# Patient Record
Sex: Female | Born: 1960 | Race: White | Hispanic: No | Marital: Married | Smoking: Former smoker
Health system: Southern US, Community
[De-identification: ages and names within clinical notes are randomized; demographics above are authoritative.]

## PROBLEM LIST (undated history)

## (undated) DIAGNOSIS — J189 Pneumonia, unspecified organism: Secondary | ICD-10-CM

## (undated) DIAGNOSIS — M1712 Unilateral primary osteoarthritis, left knee: Secondary | ICD-10-CM

## (undated) DIAGNOSIS — C439 Malignant melanoma of skin, unspecified: Secondary | ICD-10-CM

## (undated) DIAGNOSIS — K219 Gastro-esophageal reflux disease without esophagitis: Secondary | ICD-10-CM

## (undated) DIAGNOSIS — R112 Nausea with vomiting, unspecified: Secondary | ICD-10-CM

## (undated) DIAGNOSIS — R519 Headache, unspecified: Secondary | ICD-10-CM

## (undated) DIAGNOSIS — F419 Anxiety disorder, unspecified: Secondary | ICD-10-CM

## (undated) DIAGNOSIS — Z9889 Other specified postprocedural states: Secondary | ICD-10-CM

## (undated) HISTORY — PX: CARPAL TUNNEL RELEASE: SHX101

## (undated) HISTORY — PX: TUMOR REMOVAL: SHX12

## (undated) HISTORY — PX: ENDOMETRIAL ABLATION: SHX621

## (undated) HISTORY — PX: MEDIAL COLLATERAL LIGAMENT AND LATERAL COLLATERAL LIGAMENT REPAIR, KNEE: SHX2017

## (undated) HISTORY — PX: COLONOSCOPY: SHX174

## (undated) HISTORY — PX: WISDOM TOOTH EXTRACTION: SHX21

## (undated) HISTORY — PX: TONSILLECTOMY: SUR1361

## (undated) HISTORY — PX: ABDOMINAL HYSTERECTOMY: SHX81

## (undated) HISTORY — PX: MOUTH SURGERY: SHX715

---

## 1998-05-23 ENCOUNTER — Other Ambulatory Visit: Admission: RE | Admit: 1998-05-23 | Discharge: 1998-05-23 | Payer: Self-pay | Admitting: Obstetrics & Gynecology

## 1998-07-20 ENCOUNTER — Other Ambulatory Visit: Admission: RE | Admit: 1998-07-20 | Discharge: 1998-07-20 | Payer: Self-pay | Admitting: Otolaryngology

## 1999-05-24 ENCOUNTER — Other Ambulatory Visit: Admission: RE | Admit: 1999-05-24 | Discharge: 1999-05-24 | Payer: Self-pay | Admitting: Obstetrics & Gynecology

## 2000-07-16 ENCOUNTER — Other Ambulatory Visit: Admission: RE | Admit: 2000-07-16 | Discharge: 2000-07-16 | Payer: Self-pay | Admitting: Obstetrics & Gynecology

## 2001-08-01 ENCOUNTER — Other Ambulatory Visit: Admission: RE | Admit: 2001-08-01 | Discharge: 2001-08-01 | Payer: Self-pay | Admitting: Obstetrics & Gynecology

## 2002-08-03 ENCOUNTER — Other Ambulatory Visit: Admission: RE | Admit: 2002-08-03 | Discharge: 2002-08-03 | Payer: Self-pay | Admitting: Obstetrics & Gynecology

## 2003-09-08 ENCOUNTER — Other Ambulatory Visit: Admission: RE | Admit: 2003-09-08 | Discharge: 2003-09-08 | Payer: Self-pay | Admitting: Obstetrics & Gynecology

## 2004-09-13 ENCOUNTER — Other Ambulatory Visit: Admission: RE | Admit: 2004-09-13 | Discharge: 2004-09-13 | Payer: Self-pay | Admitting: Obstetrics & Gynecology

## 2005-09-28 ENCOUNTER — Other Ambulatory Visit: Admission: RE | Admit: 2005-09-28 | Discharge: 2005-09-28 | Payer: Self-pay | Admitting: Obstetrics & Gynecology

## 2009-05-04 ENCOUNTER — Ambulatory Visit: Payer: Self-pay | Admitting: Sports Medicine

## 2009-05-04 DIAGNOSIS — M2140 Flat foot [pes planus] (acquired), unspecified foot: Secondary | ICD-10-CM | POA: Insufficient documentation

## 2009-05-04 DIAGNOSIS — M79609 Pain in unspecified limb: Secondary | ICD-10-CM | POA: Insufficient documentation

## 2009-05-05 ENCOUNTER — Encounter: Payer: Self-pay | Admitting: Sports Medicine

## 2009-05-18 ENCOUNTER — Ambulatory Visit: Payer: Self-pay | Admitting: Sports Medicine

## 2009-06-06 ENCOUNTER — Ambulatory Visit: Payer: Self-pay | Admitting: Sports Medicine

## 2009-06-06 DIAGNOSIS — M84369A Stress fracture, unspecified tibia and fibula, initial encounter for fracture: Secondary | ICD-10-CM | POA: Insufficient documentation

## 2009-06-28 ENCOUNTER — Ambulatory Visit: Payer: Self-pay | Admitting: Sports Medicine

## 2009-12-22 ENCOUNTER — Ambulatory Visit: Payer: Self-pay | Admitting: Sports Medicine

## 2009-12-22 DIAGNOSIS — M76899 Other specified enthesopathies of unspecified lower limb, excluding foot: Secondary | ICD-10-CM | POA: Insufficient documentation

## 2010-01-03 ENCOUNTER — Ambulatory Visit: Payer: Self-pay | Admitting: Sports Medicine

## 2010-01-17 ENCOUNTER — Encounter: Payer: Self-pay | Admitting: Family Medicine

## 2010-01-17 ENCOUNTER — Ambulatory Visit: Payer: Self-pay | Admitting: Family Medicine

## 2010-01-17 DIAGNOSIS — M7632 Iliotibial band syndrome, left leg: Secondary | ICD-10-CM | POA: Insufficient documentation

## 2010-01-19 ENCOUNTER — Encounter: Admission: RE | Admit: 2010-01-19 | Discharge: 2010-02-15 | Payer: Self-pay | Admitting: Family Medicine

## 2010-02-16 ENCOUNTER — Ambulatory Visit: Payer: Self-pay | Admitting: Sports Medicine

## 2010-06-14 ENCOUNTER — Ambulatory Visit (HOSPITAL_BASED_OUTPATIENT_CLINIC_OR_DEPARTMENT_OTHER): Admission: RE | Admit: 2010-06-14 | Discharge: 2010-06-14 | Payer: Self-pay | Admitting: Orthopedic Surgery

## 2010-06-19 ENCOUNTER — Ambulatory Visit: Payer: Self-pay | Admitting: Sports Medicine

## 2010-06-19 DIAGNOSIS — R269 Unspecified abnormalities of gait and mobility: Secondary | ICD-10-CM | POA: Insufficient documentation

## 2010-08-22 ENCOUNTER — Ambulatory Visit
Admission: RE | Admit: 2010-08-22 | Discharge: 2010-08-22 | Payer: Self-pay | Source: Home / Self Care | Attending: Orthopedic Surgery | Admitting: Orthopedic Surgery

## 2010-09-28 NOTE — Assessment & Plan Note (Signed)
Summary: 2:15-HIP/THIGH PAIN,MC   Vital Signs:  Patient profile:   50 year old female BP sitting:   127 / 87  Vitals Entered By: Lillia Pauls CMA (December 22, 2009 2:57 PM)  History of Present Illness: Pt presents for left hip and thigh pain that she has had for one week that radiates down to her anterior knee. She has a history of a tibial stress fracture that has healed well. She was able to complete 2 half marathons afterwards without any pain. However, she has started to have left buttocks pain that travels down her lateral thigh and into her anterior knee. She can feel the pain when she walks but it is much worse when she runs. The pain is there at the start of her run and continues throughout the run. No swelling, bruising or recent injuries. The pain does wake her up at night if she rolls onto her left hip. She has tried ibuprofen and heat which are both mildly helpful. She is currently running about 18 miles per week.   Physical Exam  General:  alert and well-developed.   Head:  normocephalic and atraumatic.   Mouth:  MMM Neck:  supple.   Lungs:  normal respiratory effort.   Msk:  Left hip: Normal inspection + TTP over trochanteric bursa recreating her pain No TTP in her groin or SI joint Normal IR and ER with mild pain with ER of the hip 4/5 hip flexor and abductor strength + FABER test Neg pelvic tilt test  Equal leg lengths Neg SLR Tight IT band  Right Hip: Normal inspection Normal IR and ER of her hip without pain No TTP throughout the trochanteric bursa or groin 4/5 hip flexor and abductor strength Neg FABER test but still tight Neg pelvic tilt test  Neg SLR  Back: No scoliosis No TTP along her cervical, thoracic or lumbar spine Can walk on her heels and toes Normal strength and sensation Neurovascularly intact   Impression & Recommendations:  Problem # 1:  TROCHANTERIC BURSITIS (ICD-726.5)  Of the left hip 1. Patient consented to have a steroid  injection for pain relief Consent obtained and verified. Sterile betadine prep. Furthur cleansed with alcohol. Topical analgesic spray: Ethyl chloride. Joint: Left trochanteric bursa Approached in typical fashion with: Completed without difficulty Meds:40 mg of Kenalog, 4 cc of 1% lidocaine Needle:Spinal Aftercare instructions and Red flags advised. No heavy lifting for 3 days.  2. Given multiple cross over stretches and hip strengthening exercises 3. Return in 4-6 weeks for follow-up  Orders: Joint Aspirate / Injection, Large (20610) Kenalog 10mg  (4units) (J3301)  Problem # 2:  LEG PAIN, LEFT (ICD-729.5) From trochanteric bursa  Patient Instructions: 1)  Please do not do any heavy lifting or leg press for the next 3 days. 2)  Please do the hip exercises daily. 3)  We will see you back as needed in 3-4 weeks.

## 2010-09-28 NOTE — Assessment & Plan Note (Signed)
Summary: ORTHOTICS,MC   Vital Signs:  Patient profile:   50 year old female Height:      61 inches Weight:      124 pounds BMI:     23.51 BP sitting:   115 / 74  Vitals Entered By: Rochele Pages RN (June 19, 2010 11:32 AM)  History of Present Illness: Patient returns to try custom orthotics has been using super feet gets knee pain and had ITB sxs these are less w orthtoics but if she ups her mileage seems to get more sxs  In past year she has also had tibial stress fx and Hip issues/ SIJ issues all with relatively low running mileage 15 to 20 mpw  we discussed trying a custom orthotic to get more cushion and neutral position and see if we can cut down on injuries  Allergies (verified): 1)  ! * Cdn  Physical Exam  General:  Well-developed,well-nourished,in no acute distress; alert,appropriate and cooperative throughout examination Msk:  soft cast on RT arm from carpal tunnel surgery  normal leg lengths  mod arch bilat  tends to rotate RT foot outward w gait    overall form shows mod pronation more on RT  not tender today to testing   Impression & Recommendations:  Problem # 1:  ABNORMALITY OF GAIT (ICD-781.2)  will try to correct pronation in gait to see if we can lessen recurrent injuries  Patient was fitted for a standard, cushioned, semi-rigid orthotic.  The orthotic was heated and the patient stood on the orthotic blank positioned on the orthotic stand. The patient was positioned in subtalar neutral position and 10 degrees of ankle dorsiflexion in a weight bearing stance. After completion of molding a stable based was applied to the orthotic blank.   The blank was ground to a stable position for weight bearing. 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 Patient Level IV [25956] 2)  Orthotic Materials, each unit [L3002]

## 2010-09-28 NOTE — Assessment & Plan Note (Signed)
Summary: knee pain,mc   Vital Signs:  Patient profile:   50 year old female BP sitting:   94 / 61  Vitals Entered By: Lillia Pauls CMA (Jan 17, 2010 9:50 AM)  History of Present Illness: Pt presents with lateral left knee pain that has gotten worse for the past 2-3 weeks. She has a history of trochanteric bursitis on the left in addition to piriformis syndrome. She has been doing a lot of home exercises to work on her hip strength and to stretch out her piriformis muscle. Her hips are no longer bothering her at all. However, she has developed left lateral knee pain that becomes significant after only 5-6 steps into her jog. The pain is severe enough that she will walk 2-4 miles now instead of jogging at all. She denies any swelling or locking. No recent injuries to her left knee. She does feel a twinge of pain with going up stairs and rarely feels as if the knee may give out on her. She started using ankle weights (1.5lbs) this past week with her hip exercises.    Medications Prior to Update: 1)  None  Past History:  Past medical, surgical, family and social histories (including risk factors) reviewed, and no changes noted (except as noted below).  Family History: Reviewed history and no changes required.  Social History: Reviewed history and no changes required.  Physical Exam  General:  alert and well-developed.   Head:  normocephalic and atraumatic.   Ears:  Normal hearing Mouth:  MMM Neck:  supple.   Lungs:  normal respiratory effort.   Msk:  Left Knee: No bony abnormalities, edema or bruising Full ROM with flexion and extension + TTP over the distal IT band and about 4 cm up from the insertion of the IT band. Tightness of ITB noted as well Mild TTP over the medial and lateral joint line Neg McMurray's Neg extended leg bounce test Normal MCL, LCL, ACL and PCL with special testing 5/5 strength with resisted knee flexion and extension  Right Knee: Normal inspection,  palpation, ROM and strength Normal ACL, PCL, LCL and MCL with special testing Neg McMurray's sign  Left Hip: No TTP throughout Normal IR and ER without pain or catching Normal inspection 5/5 strength with resisted hip flexion and abduction 4/5 strength with resisted hip adduction  Right Hip: No TTP throughout Normal IR and ER without pain or catching Normal inspection 5/5 strength with resisted hip flexion and abduction  Equal Leg Lengths   Impression & Recommendations:  Problem # 1:  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 Given a handout of stretches that she can do at home 2. Use a foam roller and rolling stick to work on IT band myofascial release (she already has these at home) 3. Ice massage for 5-10 mimutes daily 4. Sent for formal physical therapy to continue to work on core strength 5. Attempted a thigh sleeve on her but we did not have a comfortable size. Consider this again in the future. 6. OK to walk/jog and slowly build back 7. Return in 4 weeks for follow-up  Problem # 2:  TROCHANTERIC BURSITIS (ICD-726.5) Assessment: Improved Continue exercises as she has gotten much stronger  Problem # 3:  LEG PAIN, LEFT (ICD-729.5) See above for treatment plan of IT band syndrome

## 2010-09-28 NOTE — Assessment & Plan Note (Signed)
Summary: F/U,MC   Vital Signs:  Patient profile:   50 year old female BP sitting:   111 / 71  Vitals Entered By: Lillia Pauls CMA (February 16, 2010 1:32 PM)  History of Present Illness: Pt presents for follow-up of left IT band syndrome. Her pain as decreased significantly and she feels as if her left knee is 100% better. She denies any symptoms during her running but she has only worked up to 2 miles per the discretion of her physical therapist. She worked with Brett Canales at [facility] and has been discharged from therapy since she has progressed very well. She has been biking and can go up to 8 miles without any pain. She has been using the foam roller and feels that this has been  helpful as well. No pain at rest or with activities and has not needed any medications for pain symptoms. She is sleeping very comfortably as well.   Past History:  Risk Factors: Smoking Status: never (05/18/2009)  Social History: Regular exercise-yes Does Patient Exercise:  yes  Physical Exam  General:  alert and well-developed.   Head:  normocephalic and atraumatic.   Ears:  Normal hearing Mouth:  MMM Neck:  supple and full ROM.   Lungs:  normal respiratory effort.   Msk:  Left Knee: No bony abnormalities, edema or bruising Full ROM without pain No TTP along the joint line or over the IT Band IT band is much looser than last time Neg McMurray's and extended leg bounce test Normal MCL, LCL, ACL and PCL with special testing 5/5 strength with resisted knee flexion and extension Neg FABER testing Neg pelvic tilt Normal IR and ER of her hip  Right Knee: Normal inspection, palpation, ROM and strength Normal MCL, LCL, ACL and PCL on special testing Neg McMurray's and extended leg bounce test Neg FABER Normal IR and ER of her hip    Impression & Recommendations:  Problem # 1:  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 Continue stretching, strengthening and gradually increasing mileage with icing afterwards 2. Return as needed  Problem # 2:  LEG PAIN, LEFT (ICD-729.5) Assessment: Improved Improved knee pain and thigh pain from IT band syndrome

## 2010-09-28 NOTE — Assessment & Plan Note (Signed)
Summary: HIP/LEG PAIN,LEG HURTS WHEN RUNNING,MC   Vital Signs:  Patient profile:   50 year old female BP sitting:   118 / 61  Vitals Entered By: Lillia Pauls CMA (Jan 03, 2010 2:00 PM)  History of Present Illness: 50 yo F runner returns for left lateral hip and thigh pain  Patient was seen 2 weeks ago, dx with trochanteric bursitis and given an injection She improved from this but still having lateral left thigh pain that starts after a few steps of running No problems on the right side Was not associated with buttock pain until she had a deep tissue massage in buttock within past week No numbness or tingling Symptoms do not go down beyond her knee. No bowel or bladder issues Has been compliant with hip abduction exercises and occasionally doing stretches Using tennis ball under left buttock which helps. Does not wake her at night now.  Physical Exam  General:  alert, well-developed, and well-nourished.   Msk:  Back: No gross deformity, bruising, or swelling. No midline or paraspinal TTP. FROM without reproduction of pain. Strength 5/5 BLEs except 4/5 with hip abduction. Reflexes 2+ and equal in patellar and achilles tendons. Sensation intact to light touch bilaterally Fabers + on left and also tight on the right Piriformis stretch also reproduces some pain on left. Negative SLRs. Leg lengths equal Favoring LLE with gait though fairly normal gait otherwise.  No knee wobble, not crossing midline with left currently.   Impression & Recommendations:  Problem # 1:  LEG PAIN, LEFT (ICD-729.5) Assessment Deteriorated 2/2 SI joint dysfunction and piriformis syndrome.  Continue abduction strengthening exercises.  Stretches reinforced and also informed needs to be compliant with these at least 6 weeks to notice much of a change.  Ok to run as long as not limping and pain <= 3/10.  Cross training as well. F/u in 6 weeks.  Patient Instructions: 1)  Cross train on the bike or an  elliptical if you can and this does not cause pain. 2)  Pick 2 or 3 stretches for your SI joint and IT band and do 3 of each and hold for 20-30 seconds once a day. 3)  Do the hip abduction exercises 3 x 10 once a day.  If these are too easy, add a 2 pound ankle weight. 4)  It is ok to run as long as you are not limping and the pain does not exceed a 3 on a scale of 10. 5)  If you develop numbness or tingling or pain radiates beyond your knee we can try some medications to calm down nerve irritation. 6)  Follow up with [place] in 6 weeks.

## 2010-09-28 NOTE — Letter (Signed)
Summary: [facility] PT Referral form  [facility] PT Referral form   Imported By: Marily Memos 01/17/2010 14:36:18  _____________________________________________________________________  External Attachment:    Type:   Image     Comment:   External Document

## 2010-10-16 NOTE — Op Note (Signed)
  NAMEBRELYNN, Emily Ballard NO.:  [id number]  MEDICAL RECORD NO.:  [id number]          PATIENT TYPE:  AMB  LOCATION:  DSC                          FACILITY:  [facility]  PHYSICIAN:  Cindee Salt, M.D.       DATE OF BIRTH:  19-May-1961  DATE OF PROCEDURE:  08/22/2010 DATE OF DISCHARGE:                              OPERATIVE REPORT   PREOPERATIVE DIAGNOSIS:  Carpal tunnel syndrome, left hand.  POSTOPERATIVE DIAGNOSIS:  Carpal tunnel syndrome, left hand.  OPERATION:  Decompression of left median nerve.  SURGEON:  Cindee Salt, MD  ASSISTANT:  Carolyne Fiscal, RN  ANESTHESIA:  Forearm-based IV regional.  ANESTHESIOLOGIST:  Sheldon Silvan, MD  HISTORY:  The patient is a 50 year old female with a history of carpal tunnel syndrome, EMG nerve conductions positive.  This has not responded to conservative treatment.  She has elected to undergo surgical release. Pre, peri, and postoperative course have been discussed along with risks and complications.  She is aware that there is no guarantee with surgery, possibility of infection, recurrence of injury to arteries, nerves, and tendons, incomplete relief of symptoms, and dystrophy.  In the preoperative area, the patient is seen, the extremity is marked by both the patient and surgeon, and antibiotic is given.  PROCEDURE:  The patient was brought to the operating room where a forearm-based IV regional anesthetic was carried out without difficulty. She was prepped using ChloraPrep, supine position with left arm free.  A 3-minute dry time was allowed.  A time-out was taken confirming the patient and procedure.  After adequate anesthesia was afforded, a longitudinal incision was made in the palm and carried down through the subcutaneous tissue.  Bleeders were electrocauterized.  Palmar fascia was split.  Superficial palmar arch was identified.  The flexor tendon to the ring little finger was identified to the ulnar side of the median nerve.   The carpal retinaculum was incised with sharp dissection.  A right-angle and Sewall retractor were placed between skin and forearm fascia.  The fascia was released for approximately a centimeter and half proximal to the wrist crease under direct vision.  Canal was explored. Air compression to the nerve was apparent.  No further lesions were identified.  The wound was irrigated.  Skin was closed with interrupted 5-0 Vicryl Rapide sutures.  Local infiltration with 0.25% Marcaine without epinephrine was given, approximately 8 mL was used.  A sterile compressive dressing and splint to the wrist with fingers free was applied.  On deflation of the tourniquet, all fingers immediately pinked.  She was taken to the recovery room for observation in satisfactory condition.  She will be discharged home to return the [facility] [place] in 1 week, on [place].          ______________________________ Cindee Salt, M.D.     GK/MEDQ  D:  08/22/2010  T:  08/22/2010  Job:  [id number]  Electronically Signed by Cindee Salt M.D. on 10/16/2010 12:14:15 PM

## 2010-10-16 NOTE — Op Note (Signed)
  Emily Ballard, MINICH NO.:  [id number]  MEDICAL RECORD NO.:  [id number]          PATIENT TYPE:  AMB  LOCATION:  DSC                          FACILITY:  [facility]  PHYSICIAN:  Cindee Salt, M.D.       DATE OF BIRTH:  20-Dec-1960  DATE OF PROCEDURE:  06/14/2010 DATE OF DISCHARGE:                              OPERATIVE REPORT   PREOPERATIVE DIAGNOSIS:  Carpal tunnel syndrome, right hand.  POSTOPERATIVE DIAGNOSIS:  Carpal tunnel syndrome, right hand.  OPERATION:  Decompression of right median nerve.  SURGEON:  Cindee Salt, MD  ASSISTANT:  Carolyne Fiscal, RN.  ANESTHESIA:  Forearm-based IV regional.  HISTORY:  The patient is a 50 year old female with a history of carpal tunnel syndrome.  EMG nerve conduction is positive.  This has not responded to conservative treatment.  She has elected to undergo surgical decompression.  Postoperative course have been discussed along with risks and complications.  She is aware there is no guarantee with the surgery, possibility of infection recurrence; injury to arteries, nerves, tendons; incomplete relief of symptoms, and dystrophy.  In the preoperative area, the patient is seen.  The extremity marked by both the patient and surgeon.  Antibiotic given.  PROCEDURE:  The patient was brought to the operating room where a forearm-based IV regional anesthetic was carried out without difficulty. __________ precautions were taken throughout the procedure.  She was prepped using ChloraPrep, supine position, right arm free.  A 3-minute dry time was allowed.  Time-out was taken, confirming the patient and procedure.  Following adequate anesthesia, a longitudinal incision was made in the palm, carried down through the subcutaneous tissue. Bleeders were electrocauterized.  Palmar fascia was split.  Superficial palmar arch identified.  The flexor tendon to the ring little finger identified to the ulnar side of the median nerve.  The  carpal retinaculum was incised with a sharp dissection.  Right-angled Sewall retractors were placed between the skin and forearm fascia.  Fascia was released for approximately a centimeter and a half proximal to the wrist crease under direct vision.  The canal was explored.  Area of compression of the nerve was apparent.  No further lesions were identified.  The wound was irrigated.  Skin closed with interrupted 5-0 Vicryl Rapide sutures.  A local infiltration with 0.25% Marcaine without epinephrine was given.  A sterile compressive dressing and splint were applied.  On deflation of the tourniquet, all fingers immediately pinked.  She was taken to the recovery room for observation in satisfactory condition.  She will be discharged home to return to the [facility] [place] in 1 week on [place].          ______________________________ Cindee Salt, M.D.     GK/MEDQ  D:  06/14/2010  T:  06/14/2010  Job:  [id number]  Electronically Signed by Cindee Salt M.D. on 10/16/2010 12:14:03 PM

## 2010-11-03 ENCOUNTER — Encounter: Payer: Self-pay | Admitting: Family Medicine

## 2010-11-03 ENCOUNTER — Ambulatory Visit: Payer: BC Managed Care – PPO | Admitting: Family Medicine

## 2010-11-03 DIAGNOSIS — IMO0002 Reserved for concepts with insufficient information to code with codable children: Secondary | ICD-10-CM | POA: Insufficient documentation

## 2010-11-07 NOTE — Assessment & Plan Note (Signed)
Summary: L GROIN PAIN X SUN   Vital Signs:  Patient profile:   50 year old female BP sitting:   121 / 73  Vitals Entered By: Lillia Pauls CMA (November 03, 2010 9:07 AM)  CC:  L groin pain.  History of Present Illness: 50yo female runner with c/o L groin pain x 5-days.  Currently training for [facility] 1/2 marathon which is in 2-weeks, running 30-35 miles/wk.  Pain located in Lt groin & medial thigh.  Denies injury or trauma, but started to feel pain & tightness along adductors following 12-mile run 5-days ago.  Denies swelling or bruising.  Taking aleve which is helpful.  Able to continue training & pain improves with activity.  Denies numbness/tingling.  Wears compressions shorts, but has not tried a thight wrap or sleeve.  Pain does not wake at night.  No significant pain with daily activities.  Hx of tibial stress fx on left 2-years ago.  Has custom orthotics that she wears while running.  Allergies: 1)  ! * Cdn  Review of Systems       per HPI, otherwise negative  Physical Exam  General:  Well-developed,well-nourished,in no acute distress; alert,appropriate and cooperative throughout examination Msk:  HIPS: FROM b/l, neg log roll.  Normal strength with pain with resisted adduction & flexion on the left.  TTP along adductors & along left pubic ramus, no tenderness over ASIS or AIIS.  No tenderness over greater troch.  No bruising or erythema.  Neg fulcrom test.  Able to perform hop test, but some pain along adductors.  FEET/ANKLES: FROM ankles b/l without pain, weakness, or instability.  Moderate arch b/l, no transverse arch breakdown.    GAIT: no leg length difference, no limp.  Slight pronation b/l with running & slight turning out of right foot.   Neurovascularly intact distally   Impression & Recommendations:  Problem # 1:  GROIN STRAIN (ICD-848.8) - rt adductor/hip flexor strain - Given body helix thigh sleeve sample -wear this with activity - Should avoid any running  for next 4-5 days, should cross-train with biking and/or elliptical during this time - When returning to running should limit runs to about 1/2 race distance - Follow-up 3-4 weeks  Patient Instructions: 1)  You have an adductor strain. 2)  Wear body helix with activity & running. 3)  No running for next 4-5 days - cross training with elliptical & stationary bike to keep up cardiovascular fitness. 4)  When returning to running try to keep runs no more than about 7-miles (1/2 race distance). 5)  Should stop running or walk if having pain or limping. 6)  continue to wear orthotics. 7)  Follow-up 3-4 weeks for re-evaluation.   Orders Added: 1)  Est. Patient Level III [04540]

## 2010-11-27 ENCOUNTER — Ambulatory Visit (INDEPENDENT_AMBULATORY_CARE_PROVIDER_SITE_OTHER): Payer: BC Managed Care – PPO | Admitting: Sports Medicine

## 2010-11-27 ENCOUNTER — Encounter: Payer: Self-pay | Admitting: Sports Medicine

## 2010-11-27 VITALS — BP 105/65 | HR 63 | Ht 63.0 in | Wt 126.0 lb

## 2010-11-27 DIAGNOSIS — IMO0002 Reserved for concepts with insufficient information to code with codable children: Secondary | ICD-10-CM

## 2010-11-27 NOTE — Patient Instructions (Signed)
Do the exercises as we discussed.  Avoid over-stretching your hip adductors.  You can continue to wear the sleeve for comfort.  Use heat before running to help loosen up the muscles.  Follow up in 4 weeks

## 2010-11-28 NOTE — Assessment & Plan Note (Signed)
-   rt adductor strain - advised continued use of  body helix thigh sleeve  - exercises given for strengthening hip adductors  - follow up in 4 weeks  - heat to loosen up muscles before exercise

## 2010-11-28 NOTE — Progress Notes (Signed)
  Subjective:    Patient ID: Emily Ballard, female    DOB: 02-28-1961, 50 y.o.   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  Started while training for 1/2 marathon, specifically with fixed resistance weighted adduction. Runs 15 miles/wk (was running 25-30 miles per week prior to injury).  Pain located in left medial thigh.  Denies any specific injury or trauma, but started to feel pain & tightness along adductors following 12-mile run 3-4 weeks ago. Denies swelling or bruising.  Ibuprofen provides some relief. Thigh sleeve (body helix) makes pain worse, but knee sleeve helps. Usually stiff first thing in morning or before run - has to warm up with walking 2 miles before trying to run. Denies numbness/tingling.  Pain does not wake at night. Also noticed left foot blisters which are new for her over the past few weeks.  Hx of tibial stress fx on left 2-years ago.  Has custom orthotics that she wears while running   Review of Systems As per HPI otherwise negative     Objective:   Physical Exam HIPS: FROM b/l; Normal strength but pain with resisted adduction & flexion on the left;  Spasm and TTP at left hip adductors; no tenderness over ASIS or AIIS.  No tenderness over greater trochanter;  No bruising or erythema. Tender to palpation at   FEET/ANKLES: FROM ankles b/l without pain, weakness, or instability.  Moderate arch b/l, no transverse arch breakdown.    GAIT: no leg length difference, +limp with running.  Slight pronation b/l with running   Neurovascularly intact distally    Ultrasound: fibrosis and increased vascularization at site of focal pain in left hip adductors    Assessment & Plan:   GROIN STRAIN (ICD-848.8) - rt adductor strain - advised continued use of  body helix thigh sleeve  - exercises given for strengthening hip adductors  - follow up in 4 weeks  - heat to loosen up muscles before exercise

## 2010-12-26 ENCOUNTER — Ambulatory Visit (INDEPENDENT_AMBULATORY_CARE_PROVIDER_SITE_OTHER): Payer: BC Managed Care – PPO | Admitting: Sports Medicine

## 2010-12-26 ENCOUNTER — Encounter: Payer: Self-pay | Admitting: Sports Medicine

## 2010-12-26 VITALS — BP 112/71

## 2010-12-26 DIAGNOSIS — IMO0002 Reserved for concepts with insufficient information to code with codable children: Secondary | ICD-10-CM

## 2010-12-26 DIAGNOSIS — M79609 Pain in unspecified limb: Secondary | ICD-10-CM

## 2010-12-26 NOTE — Progress Notes (Signed)
  Subjective:    Patient ID: Emily Ballard, female    DOB: 12-Jun-1961, 50 y.o.   MRN: [id number]  HPI  Pt presents to clinic for f/u of L hamstring strain which she states has improved tremendously.  States her pain has resolved.   Has been wearing bodyhelix thigh sleeve x 4 weeks.  Has not run in 4 weeks.  Cross training on bike.   Review of Systems     Objective:   Physical Exam     Good quad strength bilat Good abduction strength left Good HS strength with foot rotated in all positions bilat No tenderness to palpation Lt HS No HS defects, muscle looks strong Running gait reveals no limping and she appears to have even push off from the left and right leg.    Assessment & Plan:

## 2010-12-26 NOTE — Assessment & Plan Note (Signed)
This strain involved her medial hamstring. She has gotten good relief with the use of body he'll sleeve. I think she should continue using that over the next year. Try to keep up hamstring exercises at least 3 times a week.  ease back into running starting at 2 miles 3 times a week  She will return when necessary

## 2010-12-26 NOTE — Assessment & Plan Note (Signed)
Pain has currently resolved and she's not having to use any medications.

## 2010-12-26 NOTE — Patient Instructions (Signed)
Ok to ease back into running.   Continue Hamstring exercises at least 3 times per week Continue biking twice weekly Start runs at 2 miles per run for the first 2 weeks Week 3 - ok to do 3 miles per run Week 4- ok to do 4 miles per run then a 6 mile for long run Ok to increase long runs after you are comfortable with the 4 mile runs Follow up as needed

## 2011-03-22 ENCOUNTER — Encounter: Payer: Self-pay | Admitting: Sports Medicine

## 2011-03-22 ENCOUNTER — Ambulatory Visit (INDEPENDENT_AMBULATORY_CARE_PROVIDER_SITE_OTHER): Payer: BC Managed Care – PPO | Admitting: Sports Medicine

## 2011-03-22 DIAGNOSIS — IMO0002 Reserved for concepts with insufficient information to code with codable children: Secondary | ICD-10-CM

## 2011-03-23 NOTE — Progress Notes (Signed)
  Subjective:    Patient ID: Emily Ballard, female    DOB: 1960-12-24, 50 y.o.   MRN: [id number]  HPI  Emily Ballard is a 50 yo female patient complaining of right gluteal pain since last Tuesday when she was walking her dog and she felt a pop and sharp pain in her right gluteal area and she fell to the ground . She was able to walk shortly after but has been sore in her right gluteal area.The pain is 4/10, dull, worse with sitting on her right gluteus, better with rest. No numbness or tingiling radiated to her leg.   No past medical history on file. No current outpatient prescriptions on file prior to visit.   Allergies  Allergen Reactions  . Codeine   . Latex Other (See Comments)    Causes burn   History  Substance Use Topics  . Smoking status: Former Games developer  . Smokeless tobacco: Never Used  . Alcohol Use: Not on file     Review of Systems  Constitutional: Negative for fever, chills and fatigue.  Musculoskeletal:       Right gluteal pain with HPI       Objective:   Physical Exam  Constitutional: She appears well-developed and well-nourished.       BP 116/75  Pulse 69   Pulmonary/Chest: Effort normal and breath sounds normal.  Musculoskeletal:       Right gluteal area with no swelling or hematoma.There is TTP at the R ischial tuberosity. Pain with resisted extension. Sensation intact distally. R hip w/ FROM, with no pain. L Spine w/ FROM , no TTP.  Neurological: She is alert.  Skin: No rash noted. No erythema.  Psychiatric: She has a normal mood and affect.          Assessment & Plan:   1. Hamstring tear    Phase I hamstring exercise, Thigh sleeve Aleve for pain control. F/u in 4 weeks.

## 2011-04-24 ENCOUNTER — Ambulatory Visit: Payer: BC Managed Care – PPO | Admitting: Sports Medicine

## 2011-04-27 ENCOUNTER — Ambulatory Visit (INDEPENDENT_AMBULATORY_CARE_PROVIDER_SITE_OTHER): Payer: BC Managed Care – PPO | Admitting: Family Medicine

## 2011-04-27 DIAGNOSIS — S76319A Strain of muscle, fascia and tendon of the posterior muscle group at thigh level, unspecified thigh, initial encounter: Secondary | ICD-10-CM

## 2011-04-27 DIAGNOSIS — IMO0002 Reserved for concepts with insufficient information to code with codable children: Secondary | ICD-10-CM

## 2011-04-27 NOTE — Patient Instructions (Addendum)
At your gym, do 10-15 reps, 2 sets of ABduction exercises on teh machine. Start with ten pounds and work up gradually until you are slightly fatigued at the end of 15 reps. Rest a few minutes before doing repeat sets. Three to Four times a week 2. Do 15 posterior straight leg raises--I would do these daily--2-3 sets of 15.  3.Plyometrics: bound and land in running position; 3 sets of 15 level first; then up a rise; then down a rise; only advance when level is easy. No greater than 45 deg knee flexion.

## 2011-05-01 DIAGNOSIS — S76319A Strain of muscle, fascia and tendon of the posterior muscle group at thigh level, unspecified thigh, initial encounter: Secondary | ICD-10-CM | POA: Insufficient documentation

## 2011-05-01 NOTE — Progress Notes (Signed)
  Subjective:    Patient ID: Emily Ballard, female    DOB: October 02, 1960, 50 y.o.   MRN: [id number]  HPI  Ms Mirkin is coming today to f/u on her hamstring tear. She states that she is doing better, she has been using the thigh slevve and motrin, she has been doing the hamstring stretches as well. She still feel mild soreness in her right gluteal area 1-2/10 specially with activities. She is walking without a limp. No numbness or tingling radiated distally.  Patient Active Problem List  Diagnoses  . TROCHANTERIC BURSITIS  . ILIOTIBIAL BAND SYNDROME  . LEG PAIN, LEFT  . STRESS FRACTURE OF TIBIA OR FIBULA  . PES PLANUS  . ABNORMALITY OF GAIT  . GROIN STRAIN   Current Outpatient Prescriptions on File Prior to Visit  Medication Sig Dispense Refill  . cephALEXin (KEFLEX) 500 MG capsule Take 500 mg by mouth Twice daily.      . meloxicam (MOBIC) 7.5 MG tablet Take 7.5 mg by mouth Once daily as needed.      . Multiple Vitamin (MULTIVITAMIN) tablet Take 1 tablet by mouth daily.        Marland Kitchen PREMARIN 0.9 MG tablet Take 0.9 mg by mouth daily.      Marland Kitchen zolpidem (AMBIEN) 10 MG tablet Take 10 mg by mouth Once daily as needed.       Allergies  Allergen Reactions  . Codeine   . Latex Other (See Comments)    Causes burn      Review of Systems  Constitutional: Negative for fever, chills and fatigue.  Musculoskeletal: Negative for back pain, joint swelling and gait problem.       Right gluteal pain with hpi, improving  Neurological: Negative for tremors, weakness and numbness.       Objective:   Physical Exam  Constitutional: She appears well-developed and well-nourished.       There were no vitals taken for this visit.   Pulmonary/Chest: Effort normal.  Musculoskeletal:        Right gluteal area with no swelling or hematoma.There mild is TTP at the R ischial tuberosity.Mild  Pain with resisted extension. Sensation intact distally. R hip w/ FROM, with no pain. L Spine w/ FROM , no TTP.      Neurological: She is alert.  Skin: Skin is warm.  Psychiatric: She has a normal mood and affect. Judgment normal.     Assessment & Plan:   1. Hamstring strain     Phase II hamstring exercise,  Cont Thigh sleeve  Aleve for pain control.  F/u in 4 weeks

## 2011-05-25 ENCOUNTER — Ambulatory Visit (INDEPENDENT_AMBULATORY_CARE_PROVIDER_SITE_OTHER): Payer: BC Managed Care – PPO | Admitting: Family Medicine

## 2011-05-25 ENCOUNTER — Encounter: Payer: Self-pay | Admitting: Family Medicine

## 2011-05-25 VITALS — BP 114/73 | HR 63

## 2011-05-25 DIAGNOSIS — IMO0002 Reserved for concepts with insufficient information to code with codable children: Secondary | ICD-10-CM

## 2011-05-25 DIAGNOSIS — M2170 Unequal limb length (acquired), unspecified site: Secondary | ICD-10-CM

## 2011-05-25 DIAGNOSIS — S76319A Strain of muscle, fascia and tendon of the posterior muscle group at thigh level, unspecified thigh, initial encounter: Secondary | ICD-10-CM

## 2011-05-25 NOTE — Progress Notes (Addendum)
  Subjective:    Patient ID: Emily Ballard, female    DOB: 11-13-1960, 50 y.o.   MRN: [id number]  HPI  Cominh to F/U on her Right  hamstring sprain. She is 2 month after the initial injury on July/2012. She is doing much better, 80-90% as she states. She is able to alternate walking and running. She feels a mild soreness on her right hamstring with running. Her strength is back. She got new shoes, and she stopped using her custom orthotics which she states made her toes tight. She also states that she is of balance , she feels like she is tilted to the right.  Patient Active Problem List  Diagnoses  . TROCHANTERIC BURSITIS  . ILIOTIBIAL BAND SYNDROME  . LEG PAIN, LEFT  . STRESS FRACTURE OF TIBIA OR FIBULA  . PES PLANUS  . ABNORMALITY OF GAIT  . GROIN STRAIN  . Hamstring strain   Current Outpatient Prescriptions on File Prior to Visit  Medication Sig Dispense Refill  . cephALEXin (KEFLEX) 500 MG capsule Take 500 mg by mouth Twice daily.      . meloxicam (MOBIC) 7.5 MG tablet Take 7.5 mg by mouth Once daily as needed.      . Multiple Vitamin (MULTIVITAMIN) tablet Take 1 tablet by mouth daily.        Marland Kitchen PREMARIN 0.9 MG tablet Take 0.9 mg by mouth daily.      Marland Kitchen zolpidem (AMBIEN) 10 MG tablet Take 10 mg by mouth Once daily as needed.       Allergies  Allergen Reactions  . Codeine   . Latex Other (See Comments)    Causes burn      Review of Systems  Constitutional: Negative for fever, chills and fatigue.  Musculoskeletal: Negative for back pain, joint swelling and arthralgias.  Neurological: Negative for weakness and numbness.       Objective:   Physical Exam  Constitutional: She appears well-developed and well-nourished.       BP 114/73  Pulse 63   Neck: Normal range of motion. Neck supple.  Pulmonary/Chest: Effort normal.  Musculoskeletal:       Posterior right thigh w/ intact skin, no swelling no hematomas. There is no TTP in the left ischial tuberosity and proximal  hamstring muscle. No defect. There is mild pain with flexion against resistance. Strength 5/5 Gait independent without a limp. Sensation intact distally. Abductors strength 3/5 B/l. Leg length discrepancy, right leg shorter by 2 cm.     Neurological: She is alert.  Skin: Skin is warm. No rash noted. No erythema. No pallor.  Psychiatric: She has a normal mood and affect.          Assessment & Plan:   1. Hamstring strain   2. Leg length discrepancy   Right leg shorter by 2 cm   Right heel lift Abductor strengthening Hamstring stretches. Cont progressive return to running. F/u in 2 month I have reviewed the resident's note and agree with assessment and plan as stated. Denny Levy

## 2011-07-16 ENCOUNTER — Ambulatory Visit: Payer: BC Managed Care – PPO | Admitting: Family Medicine

## 2012-08-05 ENCOUNTER — Ambulatory Visit (INDEPENDENT_AMBULATORY_CARE_PROVIDER_SITE_OTHER): Payer: 59 | Admitting: Sports Medicine

## 2012-08-05 VITALS — BP 110/70 | Ht 61.0 in | Wt 124.0 lb

## 2012-08-05 DIAGNOSIS — R269 Unspecified abnormalities of gait and mobility: Secondary | ICD-10-CM

## 2012-08-05 NOTE — Progress Notes (Signed)
Patient ID: Emily Ballard, female   DOB: 03-Mar-1961, 51 y.o.   MRN: [id number]  Patient originally placed in orthotics for bilateral hip pain She is a serious runner and does a lot of half marathons uses a compression sleeve on her thigh and doing exercises she steadily lessen the amount of hip pain and now this is not very bothersome She also had problems with hamstring injuries Her gait was abnormal and so at one point we put her in orthotics and this lessen her symptoms even more She has been using the orthotics for couple years and comes back now for replacement Originally she had a tibial stress fracture and has had no sign of stress fracture since that time  Examination NAD  Both Knees: Normal to inspection with no erythema or effusion or obvious bony abnormalities. Palpation normal with no warmth or joint line tenderness or patellar tenderness or condyle tenderness. ROM normal in flexion and extension and lower leg rotation. Ligaments with solid consistent endpoints including ACL, PCL, LCL, MCL. Negative Mcmurray's and provocative meniscal tests. Non painful patellar compression. Patellar and quadriceps tendons unremarkable. Hamstring and quadriceps strength is norm  Hip abduction strength is normal bilaterallyal. Mild tenderness to palpation of the left greater trochanter none on the right  There is flattening of the longitudinal arch bilaterally and she has abnormal pronation with when not wearing orthotics

## 2012-08-05 NOTE — Assessment & Plan Note (Signed)
Patient was fitted for a : standard, cushioned, semi-rigid orthotic. The orthotic was heated and afterward the patient stood on the orthotic blank positioned on the orthotic stand. The patient was positioned in subtalar neutral position and 10 degrees of ankle dorsiflexion in a weight bearing stance. After completion of molding, a stable base was applied to the orthotic blank. The blank was ground to a stable position for weight bearing. Size: 7 red eva Base: blue EVA Posting: none Additional orthotic padding: none  Time 40 mins  Her running gait was improved in the orthotics to block the pronation  She also has much less side to side motion at her hips  She should use new pair for running and can use the older pair in walking shoes until they wear out even more  Return when necessary

## 2012-12-08 ENCOUNTER — Ambulatory Visit (INDEPENDENT_AMBULATORY_CARE_PROVIDER_SITE_OTHER): Payer: 59 | Admitting: Sports Medicine

## 2012-12-08 ENCOUNTER — Ambulatory Visit: Payer: 59 | Admitting: Family Medicine

## 2012-12-08 VITALS — BP 109/70 | Ht 61.0 in | Wt 125.0 lb

## 2012-12-08 DIAGNOSIS — IMO0002 Reserved for concepts with insufficient information to code with codable children: Secondary | ICD-10-CM

## 2012-12-08 DIAGNOSIS — S76312A Strain of muscle, fascia and tendon of the posterior muscle group at thigh level, left thigh, initial encounter: Secondary | ICD-10-CM

## 2012-12-08 NOTE — Progress Notes (Signed)
  Subjective:    Patient ID: Emily Ballard, female    DOB: 12-28-1960, 52 y.o.   MRN: [id number]  HPI  Pt presents to clinic for evaluation of lt posterior medial knee pain x 2 weeks.  Felt a pull with stretching after a run. Running about 25 miles per week.  Did [place] 1/2 marathon yesterday, pain is more posterior now.  Now feels that she cannot run without a limp Comes for evaluation      Review of Systems     Objective:   Physical Exam NAD  Slight swelling behind lt medial HS Good hip flexion strength lt Hip abduction strength good on lt Lt Knee: Normal to inspection with no erythema or effusion or obvious bony abnormalities. Palpation normal with no warmth or joint line tenderness or patellar tenderness or condyle tenderness. ROM normal in flexion and extension and lower leg rotation. Ligaments with solid consistent endpoints including ACL, PCL, LCL, MCL. Negative Mcmurray's and provocative meniscal tests. Non painful patellar compression. Patellar and quadriceps tendons unremarkable.  HS strength normal in neutral position With foot IR on lt HS was painful    With running- has antalgic gait favoring lt leg  MSK [place] Alond distal semimembranosus tendon is a hypoechoic fluid pocket This is seen on long and trans scans Some increase in doppler flow No tear seen         Assessment & Plan:

## 2012-12-08 NOTE — Assessment & Plan Note (Signed)
Distal HS strain LT Begin HS exercises Use her knee compression sleeve Cross train x 1 week Ease into running after this  If not resolved in 4 to 6 weeks we should reck and consider NTG

## 2012-12-08 NOTE — Patient Instructions (Addendum)
Try wearing knee sleeve for running, if it uncomfortable during runs- put on after running and wear for 30 min to 1 hour  Ice posterior knee after exercising   Do [place] Hamstring exercises- Diver  Extender Hamstring curl Hamstring swing Modified running lunge  Please follow up as needed  Thank you for seeing [place] today!

## 2013-01-06 ENCOUNTER — Ambulatory Visit: Payer: 59 | Admitting: Sports Medicine

## 2013-01-11 ENCOUNTER — Emergency Department (INDEPENDENT_AMBULATORY_CARE_PROVIDER_SITE_OTHER)
Admission: EM | Admit: 2013-01-11 | Discharge: 2013-01-11 | Disposition: A | Discharge status: Home / Self Care | Payer: 59 | Source: Home / Self Care

## 2013-01-11 ENCOUNTER — Encounter (HOSPITAL_COMMUNITY): Payer: Self-pay | Admitting: *Deleted

## 2013-01-11 DIAGNOSIS — S83412A Sprain of medial collateral ligament of left knee, initial encounter: Secondary | ICD-10-CM

## 2013-01-11 DIAGNOSIS — S83419A Sprain of medial collateral ligament of unspecified knee, initial encounter: Secondary | ICD-10-CM

## 2013-01-11 MED ORDER — DICLOFENAC SODIUM 1 % TD GEL: 4.0000 g | Freq: Four times a day (QID) | TRANSDERMAL | Status: DC

## 2013-01-11 NOTE — ED Provider Notes (Signed)
History     CSN: [id number]  Arrival date & time 01/11/13  1245   None     Chief Complaint  Patient presents with  . Knee Pain    (Consider location/radiation/quality/duration/timing/severity/associated sxs/prior treatment) Patient is a 52 y.o. female presenting with knee pain. The history is provided by the patient and the spouse.  Knee Pain Location:  Knee Time since incident:  3 hours (onset while running in 1/2 marathon this am) Injury: no   Knee location:  L knee Pain details:    Quality:  Aching   Severity:  Mild   Progression:  Unchanged Chronicity:  Recurrent (has seen dr fields for same.) Dislocation: no     History reviewed. No pertinent past medical history.  Past Surgical History  Procedure Laterality Date  . Cesarean section    . Abdominal hysterectomy      No family history on file.  History  Substance Use Topics  . Smoking status: Former Games developer  . Smokeless tobacco: Never Used  . Alcohol Use: Yes     Comment: occasionally    OB History   Grav Para Term Preterm Abortions TAB SAB Ect Mult Living                  Review of Systems  Constitutional: Negative.   Musculoskeletal: Positive for gait problem. Negative for joint swelling.    Allergies  Codeine and Latex  Home Medications   Current Outpatient Rx  Name  Route  Sig  Dispense  Refill  . cephALEXin (KEFLEX) 500 MG capsule   Oral   Take 500 mg by mouth Twice daily.         . Multiple Vitamin (MULTIVITAMIN) tablet   Oral   Take 1 tablet by mouth daily.           Marland Kitchen PREMARIN 0.9 MG tablet   Oral   Take 0.9 mg by mouth daily.         Marland Kitchen zolpidem (AMBIEN) 10 MG tablet   Oral   Take 10 mg by mouth Once daily as needed.         . diclofenac sodium (VOLTAREN) 1 % GEL   Topical   Apply 4 g topically 4 (four) times daily. Please instruct in dosing   3 Tube   1   . fluticasone (FLONASE) 50 MCG/ACT nasal spray   Nasal   Place 1 spray into the nose daily.         .  meloxicam (MOBIC) 7.5 MG tablet   Oral   Take 7.5 mg by mouth Once daily as needed.           BP 129/78  Pulse 73  Temp(Src) 99.1 F (37.3 C) (Oral)  Resp 17  SpO2 100%  Physical Exam  Nursing note and vitals reviewed. Constitutional: She is oriented to person, place, and time. She appears well-developed and well-nourished.  Abdominal: Soft. Bowel sounds are normal.  Musculoskeletal: She exhibits tenderness.       Left knee: She exhibits normal range of motion, no swelling, no effusion, no deformity, no LCL laxity, normal patellar mobility, no bony tenderness, normal meniscus and no MCL laxity. Tenderness found. Medial joint line tenderness noted.  Neurological: She is alert and oriented to person, place, and time.  Skin: Skin is warm and dry.    ED Course  Procedures (including critical care time)  Labs Reviewed - No data to display No results found.   1. Medial collateral ligament  sprain of knee, left, initial encounter       MDM          Linna Hoff, MD 01/11/13 1326

## 2013-01-11 NOTE — ED Notes (Signed)
Patient states severe pain left knee medial side after attempting to run a marathon of 13 miles. Patient states sharp pain and is unable to put weight on knee without pain.

## 2013-01-16 ENCOUNTER — Ambulatory Visit (INDEPENDENT_AMBULATORY_CARE_PROVIDER_SITE_OTHER): Payer: 59 | Admitting: Family Medicine

## 2013-01-16 ENCOUNTER — Encounter: Payer: Self-pay | Admitting: Family Medicine

## 2013-01-16 ENCOUNTER — Ambulatory Visit: Payer: 59 | Admitting: Family Medicine

## 2013-01-16 VITALS — BP 121/74 | HR 63 | Ht 61.0 in | Wt 125.0 lb

## 2013-01-16 DIAGNOSIS — M25562 Pain in left knee: Secondary | ICD-10-CM

## 2013-01-16 DIAGNOSIS — M25569 Pain in unspecified knee: Secondary | ICD-10-CM

## 2013-01-16 NOTE — Progress Notes (Signed)
  Subjective:    Patient ID: Emily Ballard, female    DOB: [birth year], 52 y.o.   MRN: [id number]  HPI Several weeks they got acutely worse on 18 May when she was attempting to run a half marathon. She got to mile 8 and had to stop. Had been having some medial sided left knee pain off and on for the last several weeks, worse with long runs. No specific injury. Did get new orthotics in December and got new shoes in January but they're the same type and model of shoe. Has had no change in her running environment, no other injuries, no other illnesses area has run several half marathon as before and is an enthusiastic cyclists as well.   PERTINENT  PMH / PSH: Previous history of right hamstring tear, left iliotibial band syndrome and unspecified laterality stress fracture of tibia. Review of Systems Denies unusual weight change, fever, sweats, chills. Has had no numbness in the left lower extremity, no swelling.    Objective:   Physical Exam  Vital signs are reviewed GENERAL: Well-developed female no acute distress KNEES: No effusion. Good VMO muscle bulk and strength. Left knee has full range of motion in flexion extension, ligamentously intact although she has pain with medial collateral ligament testing. The calf is soft. The popliteal space is benign. Distally she is neurovascular intact. ; Partial less than one third tear of the left in CL. There is increased Doppler activity and some surrounding fluid in this area. The menisci look fine. Patellar tendon and quadriceps tendon look fine.      Assessment & Plan:

## 2013-01-16 NOTE — Assessment & Plan Note (Signed)
Left knee MCL strain. I examined her orthotics and the new ones are just a little bit off axis. They are canting her a little bit to the medial portion of her heel. I put a small wedge on that orthotic was a very small first ray post. For the MCL strain, we will do conservative treatment. If she still having pain with ADLs in one week she'll call so we'll start nitroglycerin patch. We discussed that extensively today. If she's not having ADLs pain in a week then she'll continue with the very conservative therapy and start running in 2 weeks with half-mile. If She gets pain with that,  she'll call.   Otherwise she  Will use only the old orthotics. One month after she's totally pain-free and back to her typical running I'll have her start alternating orthotics. If she starts having pain again, call  and we will remake the left orthotic and we will be happy to do that for her for free.   She'll call the interim with problems. She did opt to buy  a body helix knee sleeve today which I think will be helpful. She was given voltaren gel at the [facility] &  OK to use that or when she runs out she can switch to Aspercreme.

## 2013-02-09 ENCOUNTER — Ambulatory Visit
Admission: RE | Admit: 2013-02-09 | Discharge: 2013-02-09 | Disposition: A | Discharge status: Home / Self Care | Payer: 59 | Source: Ambulatory Visit | Attending: Sports Medicine | Admitting: Sports Medicine

## 2013-02-09 ENCOUNTER — Ambulatory Visit (INDEPENDENT_AMBULATORY_CARE_PROVIDER_SITE_OTHER): Payer: 59 | Admitting: Sports Medicine

## 2013-02-09 ENCOUNTER — Encounter: Payer: Self-pay | Admitting: Sports Medicine

## 2013-02-09 VITALS — BP 125/74 | HR 61 | Ht 61.0 in | Wt 125.0 lb

## 2013-02-09 DIAGNOSIS — M25562 Pain in left knee: Secondary | ICD-10-CM

## 2013-02-09 DIAGNOSIS — M25569 Pain in unspecified knee: Secondary | ICD-10-CM

## 2013-02-09 NOTE — Progress Notes (Signed)
  Subjective:    Patient ID: Emily Ballard, female    DOB: April 27, 1961, 52 y.o.   MRN: [id number]  HPI Patient comes in today for followup on left knee pain. Pain has gotten worse since her last office visit. She initially injured this knee while stretching after a run back in April.. She felt a pop and a pull along the medial knee. She was initially diagnosed with a distal hamstring strain. When symptoms persisted a followup ultrasound suggested a partial tear to her MCL. He has been treated with topical anti-inflammatories, activity modification, and a body heel is compression sleeve. She has not been doing any running. She is able to bike without any pain. She felt like her pain was improving until she took a long flight to [place] [facility] one week ago. Now she is to the point to where she can't simply walk without limping. She localizes all of her pain to the medial knee. She's getting a feeling of tightness in the knee with extension. Occasional popping and feeling of wanting to give way. No problems with this knee prior to the injury back in April. No prior knee surgeries. At times her pain will radiate down the lateral aspect of the lower leg. Denies groin pain currently.    Review of Systems     Objective:   Physical Exam Well-developed, well-nourished. No acute distress. Awake alert and oriented x3  Left knee: Range of motion 0-120. Trace effusion. She is tender to palpation along the medial joint line with a positive McMurray's and positive Thessalys. There is no tenderness to palpation along the MCL and there is excellent painless stability with MCL stressing. No tenderness along the lateral joint line. Negative anterior drawer. Negative posterior drawer. No pain with patellar. Neurovascularly intact distally. Walking with a limp.      Assessment & Plan:  1. Persistent left knee pain worrisome for medial meniscal tear  Her MCL strain should be well-healed at this point. My concern is for a  medial meniscal tear that she may have suffered with her acute injury back in April. Therefore I recommended x-rays and an MRI scan to further evaluate. I will call the patient at [phone] with those results once available at which point we will delineate further treatment.

## 2013-02-09 NOTE — Patient Instructions (Addendum)
You have been scheduled for a MRI of your left knee  02/12/13 at 10 am.  Please arrive at [facility] at 9:30am.  [address] [place], [place] [phone]

## 2013-02-10 ENCOUNTER — Telehealth: Payer: Self-pay | Admitting: *Deleted

## 2013-02-10 NOTE — Telephone Encounter (Signed)
Message copied by Jacki Cones C on Tue Feb 10, 2013 10:03 AM ------      Message from: Reino Bellis R      Created: Mon Feb 09, 2013  6:07 PM      Regarding: xrays       Please call and let her know that her xrays are normal. Go ahead with MRI and I will call once results are available.            ----- Message -----         From: Rad Results In Interface         Sent: 02/09/2013   5:15 PM           To: Ralene Cork, DO                   ------

## 2013-02-10 NOTE — Telephone Encounter (Signed)
Notified pt to x-ray was normal, proceed with MRI as planned.

## 2013-02-12 ENCOUNTER — Ambulatory Visit
Admission: RE | Admit: 2013-02-12 | Discharge: 2013-02-12 | Disposition: A | Discharge status: Home / Self Care | Payer: 59 | Source: Ambulatory Visit | Attending: Sports Medicine | Admitting: Sports Medicine

## 2013-02-12 DIAGNOSIS — M25562 Pain in left knee: Secondary | ICD-10-CM

## 2013-02-13 ENCOUNTER — Telehealth: Payer: Self-pay | Admitting: *Deleted

## 2013-02-13 ENCOUNTER — Telehealth: Payer: Self-pay | Admitting: Sports Medicine

## 2013-02-13 NOTE — Telephone Encounter (Signed)
I spoke with the patient regarding MRI findings of her knee. She has a tear through the root of the medial meniscus. Minimal degenerative changes. Cruciate and collateral ligaments are intact. I recommended surgical consultation with Dr. Thurston Hole to discuss merits of arthroscopy. Patient is in agreement with that plan. I will set this referral and I will defer further workup and treatment to the discretion of Dr. Thurston Hole.

## 2013-02-13 NOTE — Telephone Encounter (Signed)
Scheduled pt for appt 02/17/13 at 9:30 am with Dr. Thurston Hole.  Pt notified of appt info.

## 2013-02-13 NOTE — Telephone Encounter (Signed)
Message copied by Mora Bellman on Fri Feb 13, 2013  9:19 AM ------      Message from: Reino Bellis R      Created: Fri Feb 13, 2013  8:16 AM      Regarding: referral       Please refer to Dr. Thurston Hole for a medial meniscal tear      ----- Message -----         From: Rad Results In Interface         Sent: 02/12/2013  12:09 PM           To: Ralene Cork, DO                   ------

## 2013-07-20 ENCOUNTER — Other Ambulatory Visit: Payer: Self-pay | Admitting: Oral Surgery

## 2013-07-20 DIAGNOSIS — M26609 Unspecified temporomandibular joint disorder, unspecified side: Secondary | ICD-10-CM

## 2013-07-29 ENCOUNTER — Ambulatory Visit
Admission: RE | Admit: 2013-07-29 | Discharge: 2013-07-29 | Disposition: A | Discharge status: Home / Self Care | Payer: 59 | Source: Ambulatory Visit | Attending: Oral Surgery | Admitting: Oral Surgery

## 2013-07-29 DIAGNOSIS — M26609 Unspecified temporomandibular joint disorder, unspecified side: Secondary | ICD-10-CM

## 2013-07-31 ENCOUNTER — Other Ambulatory Visit: Payer: 59

## 2013-08-13 ENCOUNTER — Encounter: Payer: Self-pay | Admitting: Sports Medicine

## 2013-08-13 ENCOUNTER — Ambulatory Visit (INDEPENDENT_AMBULATORY_CARE_PROVIDER_SITE_OTHER): Payer: 59 | Admitting: Sports Medicine

## 2013-08-13 VITALS — BP 119/76 | HR 78 | Ht 61.0 in | Wt 125.0 lb

## 2013-08-13 DIAGNOSIS — M25562 Pain in left knee: Secondary | ICD-10-CM

## 2013-08-13 DIAGNOSIS — S83207A Unspecified tear of unspecified meniscus, current injury, left knee, initial encounter: Secondary | ICD-10-CM | POA: Insufficient documentation

## 2013-08-13 DIAGNOSIS — S83207D Unspecified tear of unspecified meniscus, current injury, left knee, subsequent encounter: Secondary | ICD-10-CM

## 2013-08-13 DIAGNOSIS — Z5189 Encounter for other specified aftercare: Secondary | ICD-10-CM

## 2013-08-13 DIAGNOSIS — M25569 Pain in unspecified knee: Secondary | ICD-10-CM

## 2013-08-13 NOTE — Assessment & Plan Note (Signed)
Pain continues 4 months after her meniscus surgery but is not severe  I think she has made reasonable progress but still needs some changes to her program because I think she is still getting swelling

## 2013-08-13 NOTE — Progress Notes (Signed)
Patient ID: ENMA MAEDA, female   DOB: 1961/02/15, 52 y.o.   MRN: [id number]  Patient returns for followup of left knee pain She originally was thought to have an MCL tear but in any case this did not resolve and she was found to have a medial meniscus tear of the left knee She had surgery in August but still feels tightness and pain in the left knee She wonders if some change in her orthotic would help When she saw Dr. Lloyd Huger there was concerned that her in the repair orthotic actually didn't give enough medial support and may have contributed However, additional posting of the orthotic did not change the symptoms  Now she is doing the elliptical and doing biking She would like to get back to more walking but this causes some swelling and pain  Physical examination No acute distress  Right knee has -2 extension but has flexion to 145 Left knee shows -7 extension and flexion to 130 There is some puffiness in the left knee Ligaments feel stable  Leg length now is 1.5 cm short with lying primarily because of the flexion of the left knee  Gait reveals a mild antalgic limp with a Trendelenburg to the left  She has 2 pairs of orthotics and actually both of these look good to me

## 2013-08-13 NOTE — Assessment & Plan Note (Signed)
[facility] ultrasound There is a large effusion of the suprapatellar pouch extending 6 cm up into the thigh The quadriceps and patellar tendons are intact The medial joint line shows an area meniscus is absent As you move to the medial posterior half of the joint line there is still bulging of meniscus and some swelling There is slight swelling around the lateral meniscus but the meniscus itself looks normal  She is a partial meniscectomy and physical therapy I do not think she is fully recovered yet. Based on MRI and the surgical findings she probably has some element of arthritis. On examination the arthritis changes do not seem to be extensive.  I suggested that she start using her compression sleeve again. We'll put more emphasis on biking and less on the elliptical. I added a lift with foam to her left Orthotic until she can get full knee motion I don't think either pair of orthotics is a problem and she can continue using them  I would like to check her in 3 months and see if we can progress her program to more activity

## 2013-08-25 ENCOUNTER — Encounter: Payer: 59 | Admitting: Sports Medicine

## 2013-11-12 ENCOUNTER — Ambulatory Visit: Payer: 59 | Admitting: Sports Medicine

## 2013-11-26 ENCOUNTER — Ambulatory Visit (INDEPENDENT_AMBULATORY_CARE_PROVIDER_SITE_OTHER): Payer: 59 | Admitting: Sports Medicine

## 2013-11-26 ENCOUNTER — Encounter: Payer: Self-pay | Admitting: Sports Medicine

## 2013-11-26 VITALS — BP 110/60 | Ht 61.0 in | Wt 133.0 lb

## 2013-11-26 DIAGNOSIS — M25562 Pain in left knee: Secondary | ICD-10-CM

## 2013-11-26 DIAGNOSIS — M2140 Flat foot [pes planus] (acquired), unspecified foot: Secondary | ICD-10-CM

## 2013-11-26 DIAGNOSIS — M25569 Pain in unspecified knee: Secondary | ICD-10-CM

## 2013-11-26 NOTE — Assessment & Plan Note (Signed)
She continues to advance slowly. She's not return to running yet. Recommended she continue range of motion exercises as well as exercise bike and elliptical.

## 2013-11-26 NOTE — Progress Notes (Signed)
Patient ID: Emily Ballard, female   DOB: 26-Oct-1960, 53 y.o.   MRN: [id number] 53 year old female present for followup of left knee pain. Show partial meniscectomy of the left knee in August of 2014 as had persistent pain and difficulty in returning to run. Overall she feels that her flexibility in the knee has improved somewhat although she does continue to have some pain in his start running. She continues to ride an exercise bike, ride outdoors and uses the elliptical. The heel lift to compensate for her leg length discrepancy has helped improve her overall leg pain. She is also working on both extension and flexion of her left knee. She does report that her knee swells in the evening on occasion. She's been using a body helix compression sleeve after exercising to help alleviate the symptoms. In addition she's been icing her knee.  Review of systems as per history of present illness otherwise all systems negative  Examination: BP 110/60  Ht 5\' 1"  (1.549 m)  Wt 133 lb (60.328 kg)  BMI 25.14 kg/m2 Well-developed well-nourished 53 year old female awake alert and oriented no acute distress Knee: Mild effusion noted  Palpation normal with no warmth or joint line tenderness or patellar tenderness or condyle tenderness. ROM -4 extension of the left knee/-2 right knee, 135 flexion left/greater than 140 right Ligaments with solid consistent endpoints including ACL, PCL, LCL, MCL. Negative Mcmurray's and provocative meniscal tests. Non painful patellar compression. Patellar and quadriceps tendons unremarkable. Hamstring and quadriceps strength is normal.

## 2013-11-26 NOTE — Assessment & Plan Note (Signed)
Small modification made to the left arch of one of her orthotics appears today. It was rubbing and I shaved off a small portion of it. This alleviated the symptoms.

## 2014-02-22 ENCOUNTER — Ambulatory Visit (INDEPENDENT_AMBULATORY_CARE_PROVIDER_SITE_OTHER): Payer: 59 | Admitting: Licensed Clinical Social Worker

## 2014-02-22 DIAGNOSIS — F411 Generalized anxiety disorder: Secondary | ICD-10-CM | POA: Diagnosis not present

## 2014-03-12 ENCOUNTER — Ambulatory Visit (INDEPENDENT_AMBULATORY_CARE_PROVIDER_SITE_OTHER): Payer: 59 | Admitting: Licensed Clinical Social Worker

## 2014-03-12 DIAGNOSIS — F411 Generalized anxiety disorder: Secondary | ICD-10-CM

## 2014-04-05 ENCOUNTER — Ambulatory Visit (INDEPENDENT_AMBULATORY_CARE_PROVIDER_SITE_OTHER): Payer: 59 | Admitting: Licensed Clinical Social Worker

## 2014-04-05 DIAGNOSIS — F411 Generalized anxiety disorder: Secondary | ICD-10-CM

## 2014-04-23 DIAGNOSIS — K6289 Other specified diseases of anus and rectum: Secondary | ICD-10-CM | POA: Insufficient documentation

## 2014-04-23 DIAGNOSIS — K644 Residual hemorrhoidal skin tags: Secondary | ICD-10-CM | POA: Insufficient documentation

## 2014-05-20 ENCOUNTER — Ambulatory Visit (INDEPENDENT_AMBULATORY_CARE_PROVIDER_SITE_OTHER): Payer: 59 | Admitting: Sports Medicine

## 2014-05-20 ENCOUNTER — Encounter: Payer: Self-pay | Admitting: Sports Medicine

## 2014-05-20 VITALS — BP 101/57 | HR 56 | Ht 61.0 in | Wt 133.0 lb

## 2014-05-20 DIAGNOSIS — M7742 Metatarsalgia, left foot: Principal | ICD-10-CM

## 2014-05-20 DIAGNOSIS — M7750 Other enthesopathy of unspecified foot: Secondary | ICD-10-CM

## 2014-05-20 DIAGNOSIS — M7741 Metatarsalgia, right foot: Secondary | ICD-10-CM | POA: Insufficient documentation

## 2014-05-20 NOTE — Assessment & Plan Note (Addendum)
Forefoot pain secondary to flattening of the transverse arches bilaterally. On exam the right foot is further broken down but patient complains of more severe pain on the left - heel lifts ground down and metatarsal pads placed bilaterally - patient to return for pads in her dress shoes if these provide relief - recommend gradual return to running with walk-run to start  Patient had some pain relief and able to jog after placement of the MT pads She has 2 fxnal pairs of orthotics

## 2014-05-20 NOTE — Progress Notes (Signed)
Patient presents for forefoot pain L>R. Patient reports that about 2 months ago she stopped wearing her orthotics because of pain in her knee related to the heel lift. Since removing them she reports her knee pain has resolved but she started to have pain in the front part of her foot and some tingling and numbness on the toes. The pain is worse with walking or bike riding and is present with any weight bearing. She saw Dr. Noemi Chapel who recommended that she see Dr. Oneida Alar to have her orthotics revised.  She finally has recovered from the meniscus surgery and seems to have full movement of left knee now.  On exam she has flattening of both longitudinal and transverse arches, R>L. On the right she has separation of the 1st-3rd toes from the 4th and 5th. On the left she has lifting of the great toe and turning and side callous formation of the 5th. She has tenderness on the inferior surface of the left foot between the 2nd and 3rd metatarsal heads. Mild TTP across left MT arch.  Running gait is neutral

## 2015-01-05 ENCOUNTER — Other Ambulatory Visit: Payer: Self-pay | Admitting: Obstetrics & Gynecology

## 2015-01-06 LAB — CYTOLOGY - PAP

## 2015-01-18 ENCOUNTER — Ambulatory Visit (INDEPENDENT_AMBULATORY_CARE_PROVIDER_SITE_OTHER): Payer: 59

## 2015-01-18 ENCOUNTER — Ambulatory Visit (INDEPENDENT_AMBULATORY_CARE_PROVIDER_SITE_OTHER): Payer: 59 | Admitting: Podiatry

## 2015-01-18 ENCOUNTER — Encounter: Payer: Self-pay | Admitting: Podiatry

## 2015-01-18 VITALS — BP 118/73 | HR 84 | Resp 16

## 2015-01-18 DIAGNOSIS — M21612 Bunion of left foot: Secondary | ICD-10-CM

## 2015-01-18 DIAGNOSIS — M779 Enthesopathy, unspecified: Secondary | ICD-10-CM

## 2015-01-18 DIAGNOSIS — M2012 Hallux valgus (acquired), left foot: Secondary | ICD-10-CM | POA: Diagnosis not present

## 2015-01-18 DIAGNOSIS — M7752 Other enthesopathy of left foot: Secondary | ICD-10-CM | POA: Diagnosis not present

## 2015-01-18 DIAGNOSIS — M778 Other enthesopathies, not elsewhere classified: Secondary | ICD-10-CM

## 2015-01-18 NOTE — Progress Notes (Signed)
   Subjective:    Patient ID: Emily Ballard, female    DOB: [birth year], 54 y.o.   MRN: [id number]  HPI Comments: "I have problems with the left foot"  Patient c/o aching plantar forefoot left for several months. She used to be an active runner, still active with outdoor activities. She also has arthritis in the toes and a bunion left. She has orthotics made by Dr. Oneida Alar with met pads. She is scheduled for knee surgery in October.     Review of Systems  Cardiovascular: Positive for leg swelling.       Relates most of leg swelling to her knee problem  Musculoskeletal: Positive for arthralgias.  All other systems reviewed and are negative.      Objective:   Physical Exam: I have reviewed her past medical history medications allergies surgery social history and review of systems. Pulses are strongly palpable left. Neurologic sensorium is intact percent ostial monofilament. Deep tendon reflexes are intact bilateral muscle strength +5 over 5 dorsiflexion plantar flexors and inverters everters onto the musculatures intact. Orthopedic evaluation was resolved with distal to the ankle range of motion without crepitation. Mild rigid hammertoe deformity second digit left foot pain on palpation second digit left foot particularly on end range of motion. The majority of the pain is located at the second metatarsophalangeal joint. Mild hallux valgus deformity is also noted left. All this is confirmed on radiographs.        Assessment & Plan:  Assessment: Hallux abductovalgus deformity of the left foot. Elongated plantarflexed second metatarsal resulting in capsulitis with hammertoe deformity rigid in nature the PIPJ second digit left.  Plan: Discussed etiology pathology conservative versus surgical therapies. Injected second metatarsophalangeal joint today after sterile Betadine skin prep. Discussed appropriate shoe gear stretching exercises ice therapy and shoe modifications. follow up with her in 1  month to 6 weeks if necessary.

## 2015-03-29 ENCOUNTER — Encounter: Payer: Self-pay | Admitting: Podiatry

## 2015-03-29 ENCOUNTER — Ambulatory Visit (INDEPENDENT_AMBULATORY_CARE_PROVIDER_SITE_OTHER): Payer: 59 | Admitting: Podiatry

## 2015-03-29 VITALS — BP 131/84 | HR 76 | Resp 12

## 2015-03-29 DIAGNOSIS — M778 Other enthesopathies, not elsewhere classified: Secondary | ICD-10-CM

## 2015-03-29 DIAGNOSIS — M779 Enthesopathy, unspecified: Principal | ICD-10-CM

## 2015-03-29 DIAGNOSIS — M7752 Other enthesopathy of left foot: Secondary | ICD-10-CM

## 2015-03-30 NOTE — Progress Notes (Signed)
She presents today for follow-up of capsulitis second metatarsophalangeal joint of the left foot. She states that it has improved considerably but it is still sore. She states that she takes meloxicam occasionally.  Objective: Vital signs are stable she is alert and oriented 3. Still has pain on palpation and range of motion of the second metatarsophalangeal joint of the left foot.  Assessment: Capsulitis second metatarsophalangeal joint left.  Plan: Injected 5 mg of Kenalog after sterile Betadine skin prep left second metatarsophalangeal joint. Encouraged her to continue anti-inflammatory's on a consistent basis discussed orthotics.

## 2015-06-08 ENCOUNTER — Encounter (HOSPITAL_COMMUNITY): Payer: Self-pay | Admitting: Physician Assistant

## 2015-06-08 DIAGNOSIS — M1712 Unilateral primary osteoarthritis, left knee: Secondary | ICD-10-CM | POA: Diagnosis present

## 2015-06-08 NOTE — H&P (Signed)
TOTAL KNEE ADMISSION H&P  Patient is being admitted for left total knee arthroplasty.  Subjective:  Chief Complaint:left knee pain.  HPI: Emily Ballard, 54 y.o. female, has a history of pain and functional disability in the left knee due to arthritis and has failed non-surgical conservative treatments for greater than 12 weeks to includeNSAID's and/or analgesics, corticosteriod injections, viscosupplementation injections, flexibility and strengthening excercises, supervised PT with diminished ADL's post treatment, use of assistive devices, weight reduction as appropriate and activity modification.  Onset of symptoms was gradual, starting 10 years ago with gradually worsening course since that time. The patient noted prior procedures on the knee to include  arthroscopy and menisectomy on the left knee(s).  Patient currently rates pain in the left knee(s) at 10 out of 10 with activity. Patient has night pain, worsening of pain with activity and weight bearing, pain that interferes with activities of daily living, crepitus and joint swelling.  Patient has evidence of subchondral sclerosis, periarticular osteophytes and joint space narrowing by imaging studies.  There is no active infection.  Patient Active Problem List   Diagnosis Date Noted  . Primary localized osteoarthritis of left knee   . Metatarsalgia of both feet 05/20/2014  . Tear of left meniscus as current injury 08/13/2013  . Left knee pain 01/16/2013  . Hamstring strain 05/01/2011  . GROIN STRAIN 11/03/2010  . ABNORMALITY OF GAIT 06/19/2010  . 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 TROCHANTERIC BURSITIS 12/22/2009  . STRESS FRACTURE OF TIBIA OR FIBULA 06/06/2009  . LEG PAIN, LEFT 05/04/2009  . PES PLANUS 05/04/2009   Past Medical History  Diagnosis Date  . Primary localized osteoarthritis of left knee     Past Surgical History  Procedure Laterality Date  . Cesarean section    . Abdominal hysterectomy      No prescriptions  prior to admission   Allergies  Allergen Reactions  . Codeine Nausea Only  . Latex Other (See Comments)    Causes burn  . Shellfish Allergy Diarrhea and Nausea And Vomiting   No current facility-administered medications for this encounter.  Current outpatient prescriptions:  .  ALPRAZolam (XANAX) 0.5 MG tablet, Take 0.5 mg by mouth at bedtime as needed for anxiety., Disp: , Rfl:  .  Ascorbic Acid (VITAMIN C PO), Take 1 tablet by mouth daily. , Disp: , Rfl:  .  cephALEXin (KEFLEX) 500 MG capsule, Take 500 mg by mouth daily. , Disp: , Rfl:  .  fexofenadine (ALLEGRA) 180 MG tablet, Take 180 mg by mouth daily., Disp: , Rfl:  .  Glucosamine-Chondroitin-MSM (TRIPLE FLEX PO), Take 1 tablet by mouth daily., Disp: , Rfl:  .  Magnesium 250 MG TABS, Take 250 mg by mouth daily., Disp: , Rfl:  .  Melatonin 10 MG TBCR, Take 10 mg by mouth at bedtime., Disp: , Rfl:  .  meloxicam (MOBIC) 15 MG tablet, Take 15 mg by mouth daily., Disp: , Rfl:  .  Methylsulfonylmethane (MSM) 1000 MG TABS, Take 1 tablet by mouth daily. , Disp: , Rfl:  .  OVER THE COUNTER MEDICATION, Take 1 tablet by mouth daily. Instaflush, Disp: , Rfl:  .  PREMARIN 0.9 MG tablet, Take 0.9 mg by mouth daily., Disp: , Rfl:  .  Probiotic Product (PROBIOTIC DAILY PO), Take 1 tablet by mouth daily. , Disp: , Rfl:  .  traZODone (DESYREL) 50 MG tablet, Take 37.5 mg by mouth at bedtime. , Disp: , Rfl:  .  Multiple Minerals-Vitamins (CALCIUM & VIT  D3 BONE HEALTH PO), Take 1 tablet by mouth daily., Disp: , Rfl:     Social History  Substance Use Topics  . Smoking status: Former Research scientist (life sciences)  . Smokeless tobacco: Never Used  . Alcohol Use: Yes     Comment: occasionally    No family history on file.   Review of Systems  Constitutional: Negative.   HENT: Negative.   Eyes: Negative.   Respiratory: Negative.   Cardiovascular: Negative.   Gastrointestinal: Negative.   Genitourinary: Negative.   Musculoskeletal: Positive for back pain and joint  pain.  Skin: Negative.   Neurological: Negative.   Endo/Heme/Allergies: Negative.   Psychiatric/Behavioral: Negative.     Objective:  Physical Exam  Constitutional: She appears well-developed and well-nourished.  HENT:  Head: Normocephalic and atraumatic.  Mouth/Throat: Oropharynx is clear and moist.  Eyes: Conjunctivae are normal. Pupils are equal, round, and reactive to light.  Neck: Neck supple.  Cardiovascular: Normal rate and normal heart sounds.   Respiratory: Effort normal.  GI: Soft.  Genitourinary:  Not pertinent to current symptomatology therefore not examined.  Musculoskeletal:  Examination of her left knee reveals pain medially and laterally.  There is 1+ synovitis. Full range of motion.  The knee is stable. Right knee full range of motion without pain swelling or deformity.  2+DP  Neurological: She is alert.  Skin: Skin is warm.  Psychiatric: She has a normal mood and affect.    Vital signs in last 24 hours: Temp:  [98.7 F (37.1 C)] 98.7 F (37.1 C) (10/12 1400) Pulse Rate:  [86] 86 (10/12 1400) BP: (128)/(77) 128/77 mmHg (10/12 1400) SpO2:  [98 %] 98 % (10/12 1400) Weight:  [60.328 kg (133 lb)] 60.328 kg (133 lb) (10/12 1400)  Labs:   Estimated body mass index is 25.97 kg/(m^2) as calculated from the following:   Height as of this encounter: 5' (1.524 m).   Weight as of this encounter: 60.328 kg (133 lb).   Imaging Review Plain radiographs demonstrate severe degenerative joint disease of the left knee(s). The overall alignment issignificant varus. The bone quality appears to be good for age and reported activity level.  Assessment/Plan:  End stage arthritis, left knee  Active Problems:   Primary localized osteoarthritis of left knee   The patient history, physical examination, clinical judgment of the provider and imaging studies are consistent with end stage degenerative joint disease of the left knee(s) and total knee arthroplasty is deemed  medically necessary. The treatment options including medical management, injection therapy arthroscopy and arthroplasty were discussed at length. The risks and benefits of total knee arthroplasty were presented and reviewed. The risks due to aseptic loosening, infection, stiffness, patella tracking problems, thromboembolic complications and other imponderables were discussed. The patient acknowledged the explanation, agreed to proceed with the plan and consent was signed. Patient is being admitted for inpatient treatment for surgery, pain control, PT, OT, prophylactic antibiotics, VTE prophylaxis, progressive ambulation and ADL's and discharge planning. The patient is planning to be discharged home with home health services   Balinda Heacock A. Kaleen Mask Physician Assistant [facility] [phone]  06/08/2015, 2:59 PM

## 2015-06-08 NOTE — Pre-Procedure Instructions (Signed)
Emily Ballard  06/08/2015      Your procedure is scheduled on Monday, October 24th, 2016 at 7:30 AM.  Report to [facility] at 5:30 A.M.  Call this number if you have problems the morning of surgery: [phone]   Remember:  Do not eat food or drink liquids after midnight.   Take these medicines the morning of surgery with A SIP OF WATER: Fexofenadine (Allegra).     Stop taking the following: Meloxicam (Mobic), Aspirin, NSAIDS, Aleve, Naproxen, Ibuprofen, Advil, Motrin, BC's, Goody's, fish oil, all herbal medications, and all vitamins on Monday October 17th    Do not wear jewelry, make-up or nail polish.  Do not wear lotions, powders, or perfumes.    Do not shave 48 hours prior to surgery.  Do not bring valuables to the hospital.  [facility] is not responsible for any belongings or valuables.  Contacts, dentures or bridgework may not be worn into surgery.  Leave your suitcase in the car.  After surgery it may be brought to your room.  For patients admitted to the hospital, discharge time will be determined by your treatment team.  Patients discharged the day of surgery will not be allowed to drive home.   Special instructions:  Shower using CHG soap the night before and the morning of your surgery  Please read over the following fact sheets that you were given. Pain Booklet, Coughing and Deep Breathing, MRSA Information and Surgical Site Infection Prevention

## 2015-06-09 ENCOUNTER — Encounter (HOSPITAL_COMMUNITY): Payer: Self-pay

## 2015-06-09 ENCOUNTER — Encounter (HOSPITAL_COMMUNITY)
Admission: RE | Admit: 2015-06-09 | Discharge: 2015-06-09 | Disposition: A | Discharge status: Home / Self Care | Payer: 59 | Source: Ambulatory Visit | Attending: Orthopedic Surgery | Admitting: Orthopedic Surgery

## 2015-06-09 DIAGNOSIS — Z01812 Encounter for preprocedural laboratory examination: Secondary | ICD-10-CM | POA: Diagnosis not present

## 2015-06-09 DIAGNOSIS — Z0183 Encounter for blood typing: Secondary | ICD-10-CM | POA: Diagnosis not present

## 2015-06-09 DIAGNOSIS — Z01818 Encounter for other preprocedural examination: Secondary | ICD-10-CM | POA: Insufficient documentation

## 2015-06-09 DIAGNOSIS — M179 Osteoarthritis of knee, unspecified: Secondary | ICD-10-CM | POA: Insufficient documentation

## 2015-06-09 DIAGNOSIS — R9431 Abnormal electrocardiogram [ECG] [EKG]: Secondary | ICD-10-CM | POA: Insufficient documentation

## 2015-06-09 HISTORY — DX: Other specified postprocedural states: R11.2

## 2015-06-09 HISTORY — DX: Gastro-esophageal reflux disease without esophagitis: K21.9

## 2015-06-09 HISTORY — DX: Pneumonia, unspecified organism: J18.9

## 2015-06-09 HISTORY — DX: Other specified postprocedural states: Z98.890

## 2015-06-09 HISTORY — DX: Nausea with vomiting, unspecified: R11.2

## 2015-06-09 LAB — CBC WITH DIFFERENTIAL/PLATELET
BASOS ABS: 0 10*3/uL (ref 0.0–0.1)
Basophils Relative: 1 %
EOS PCT: 3 %
Eosinophils Absolute: 0.2 10*3/uL (ref 0.0–0.7)
HCT: 42.5 % (ref 36.0–46.0)
Hemoglobin: 14.1 g/dL (ref 12.0–15.0)
LYMPHS PCT: 39 %
Lymphs Abs: 2.1 10*3/uL (ref 0.7–4.0)
MCH: 30.3 pg (ref 26.0–34.0)
MCHC: 33.2 g/dL (ref 30.0–36.0)
MCV: 91.4 fL (ref 78.0–100.0)
Monocytes Absolute: 0.4 10*3/uL (ref 0.1–1.0)
Monocytes Relative: 8 %
Neutro Abs: 2.7 10*3/uL (ref 1.7–7.7)
Neutrophils Relative %: 49 %
PLATELETS: 265 10*3/uL (ref 150–400)
RBC: 4.65 MIL/uL (ref 3.87–5.11)
RDW: 12.2 % (ref 11.5–15.5)
WBC: 5.5 10*3/uL (ref 4.0–10.5)

## 2015-06-09 LAB — COMPREHENSIVE METABOLIC PANEL
ALT: 28 U/L (ref 14–54)
AST: 35 U/L (ref 15–41)
Albumin: 3.7 g/dL (ref 3.5–5.0)
Alkaline Phosphatase: 40 U/L (ref 38–126)
Anion gap: 8 (ref 5–15)
BUN: 13 mg/dL (ref 6–20)
CHLORIDE: 105 mmol/L (ref 101–111)
CO2: 27 mmol/L (ref 22–32)
CREATININE: 0.99 mg/dL (ref 0.44–1.00)
Calcium: 9.5 mg/dL (ref 8.9–10.3)
GFR calc Af Amer: >60 mL/min (ref >60)
GLUCOSE: 90 mg/dL (ref 65–99)
Potassium: 4.5 mmol/L (ref 3.5–5.1)
SODIUM: 140 mmol/L (ref 135–145)
Total Bilirubin: 0.7 mg/dL (ref 0.3–1.2)
Total Protein: 6.6 g/dL (ref 6.5–8.1)

## 2015-06-09 LAB — APTT: APTT: 28 s (ref 24–37)

## 2015-06-09 LAB — SURGICAL PCR SCREEN
MRSA, PCR: NEGATIVE
Staphylococcus aureus: NEGATIVE

## 2015-06-09 LAB — TYPE AND SCREEN
ABO/RH(D): O POS
Antibody Screen: NEGATIVE

## 2015-06-09 LAB — PROTIME-INR
INR: 1.04 (ref 0.00–1.49)
Prothrombin Time: 13.8 seconds (ref 11.6–15.2)

## 2015-06-09 LAB — ABO/RH: ABO/RH(D): O POS

## 2015-06-10 LAB — URINE CULTURE

## 2015-06-19 MED ORDER — CHLORHEXIDINE GLUCONATE 4 % EX LIQD: 60.0000 mL | Freq: Once | CUTANEOUS | Status: DC

## 2015-06-19 MED ORDER — POVIDONE-IODINE 10 % EX SOLN
Freq: Once | CUTANEOUS | Status: DC
  Filled 2015-06-19: qty 118

## 2015-06-19 MED ORDER — LACTATED RINGERS IV SOLN
INTRAVENOUS | Status: DC
  Administered 2015-06-20 (×2): via INTRAVENOUS

## 2015-06-19 MED ORDER — CEFAZOLIN SODIUM-DEXTROSE 2-3 GM-% IV SOLR
2.0000 g | INTRAVENOUS | Status: AC
  Administered 2015-06-20: 2 g via INTRAVENOUS
  Filled 2015-06-19: qty 50

## 2015-06-20 ENCOUNTER — Encounter (HOSPITAL_COMMUNITY): Payer: Self-pay | Admitting: Certified Registered"

## 2015-06-20 ENCOUNTER — Inpatient Hospital Stay (HOSPITAL_COMMUNITY): Payer: 59 | Admitting: Certified Registered"

## 2015-06-20 ENCOUNTER — Encounter (HOSPITAL_COMMUNITY)
Admission: RE | Disposition: A | Discharge status: Home Health | Payer: Self-pay | Source: Ambulatory Visit | Attending: Orthopedic Surgery

## 2015-06-20 ENCOUNTER — Inpatient Hospital Stay (HOSPITAL_COMMUNITY)
Admission: RE | Admit: 2015-06-20 | Discharge: 2015-06-21 | DRG: 470 | Disposition: A | Discharge status: Home Health | Payer: 59 | Source: Ambulatory Visit | Attending: Orthopedic Surgery | Admitting: Orthopedic Surgery

## 2015-06-20 DIAGNOSIS — K219 Gastro-esophageal reflux disease without esophagitis: Secondary | ICD-10-CM | POA: Diagnosis present

## 2015-06-20 DIAGNOSIS — M25562 Pain in left knee: Secondary | ICD-10-CM | POA: Diagnosis present

## 2015-06-20 DIAGNOSIS — M179 Osteoarthritis of knee, unspecified: Secondary | ICD-10-CM | POA: Diagnosis present

## 2015-06-20 DIAGNOSIS — M1712 Unilateral primary osteoarthritis, left knee: Secondary | ICD-10-CM | POA: Diagnosis present

## 2015-06-20 DIAGNOSIS — Z87891 Personal history of nicotine dependence: Secondary | ICD-10-CM

## 2015-06-20 DIAGNOSIS — M1710 Unilateral primary osteoarthritis, unspecified knee: Secondary | ICD-10-CM | POA: Diagnosis present

## 2015-06-20 HISTORY — DX: Unilateral primary osteoarthritis, left knee: M17.12

## 2015-06-20 HISTORY — PX: TOTAL KNEE ARTHROPLASTY: SHX125

## 2015-06-20 SURGERY — ARTHROPLASTY, KNEE, TOTAL
Anesthesia: Spinal | Laterality: Left

## 2015-06-20 MED ORDER — PHENYLEPHRINE 40 MCG/ML (10ML) SYRINGE FOR IV PUSH (FOR BLOOD PRESSURE SUPPORT)
PREFILLED_SYRINGE | INTRAVENOUS | Status: AC
  Filled 2015-06-20: qty 10

## 2015-06-20 MED ORDER — EPHEDRINE SULFATE 50 MG/ML IJ SOLN
INTRAMUSCULAR | Status: AC
  Filled 2015-06-20: qty 1

## 2015-06-20 MED ORDER — BUPIVACAINE-EPINEPHRINE (PF) 0.25% -1:200000 IJ SOLN
INTRAMUSCULAR | Status: AC
  Filled 2015-06-20: qty 30

## 2015-06-20 MED ORDER — ALPRAZOLAM 0.5 MG PO TABS
0.5000 mg | ORAL_TABLET | Freq: Every evening | ORAL | Status: DC | PRN
  Administered 2015-06-21: 0.5 mg via ORAL
  Filled 2015-06-20: qty 1

## 2015-06-20 MED ORDER — MIDAZOLAM HCL 2 MG/2ML IJ SOLN
INTRAMUSCULAR | Status: AC
  Filled 2015-06-20: qty 4

## 2015-06-20 MED ORDER — DEXAMETHASONE SODIUM PHOSPHATE 4 MG/ML IJ SOLN
INTRAMUSCULAR | Status: AC
  Filled 2015-06-20: qty 2

## 2015-06-20 MED ORDER — HYDROMORPHONE HCL 1 MG/ML IJ SOLN
1.0000 mg | INTRAMUSCULAR | Status: DC | PRN
  Administered 2015-06-20 – 2015-06-21 (×7): 1 mg via INTRAVENOUS
  Filled 2015-06-20 (×8): qty 1

## 2015-06-20 MED ORDER — HYDROMORPHONE HCL 1 MG/ML IJ SOLN
0.2500 mg | INTRAMUSCULAR | Status: DC | PRN
  Administered 2015-06-20: 0.5 mg via INTRAVENOUS
  Administered 2015-06-20: 0.25 mg via INTRAVENOUS

## 2015-06-20 MED ORDER — FENTANYL CITRATE (PF) 100 MCG/2ML IJ SOLN
INTRAMUSCULAR | Status: DC | PRN
  Administered 2015-06-20 (×2): 25 ug via INTRAVENOUS
  Administered 2015-06-20: 50 ug via INTRAVENOUS
  Administered 2015-06-20 (×2): 25 ug via INTRAVENOUS

## 2015-06-20 MED ORDER — MAGNESIUM 250 MG PO TABS: 250.0000 mg | ORAL_TABLET | Freq: Every day | ORAL | Status: DC

## 2015-06-20 MED ORDER — HYDROMORPHONE HCL 1 MG/ML IJ SOLN
INTRAMUSCULAR | Status: AC
  Administered 2015-06-20: 0.25 mg via INTRAVENOUS
  Filled 2015-06-20: qty 1

## 2015-06-20 MED ORDER — POTASSIUM CHLORIDE IN NACL 20-0.9 MEQ/L-% IV SOLN
INTRAVENOUS | Status: DC
  Administered 2015-06-20: 10:00:00 via INTRAVENOUS
  Filled 2015-06-20: qty 1000

## 2015-06-20 MED ORDER — SCOPOLAMINE 1 MG/3DAYS TD PT72
MEDICATED_PATCH | TRANSDERMAL | Status: DC | PRN
  Administered 2015-06-20: 1 via TRANSDERMAL

## 2015-06-20 MED ORDER — PROPOFOL 10 MG/ML IV BOLUS
INTRAVENOUS | Status: AC
  Filled 2015-06-20: qty 20

## 2015-06-20 MED ORDER — ONDANSETRON HCL 4 MG/2ML IJ SOLN
INTRAMUSCULAR | Status: AC
  Filled 2015-06-20: qty 4

## 2015-06-20 MED ORDER — KETOROLAC TROMETHAMINE 30 MG/ML IJ SOLN
INTRAMUSCULAR | Status: AC
  Filled 2015-06-20: qty 1

## 2015-06-20 MED ORDER — EPHEDRINE SULFATE 50 MG/ML IJ SOLN
INTRAMUSCULAR | Status: DC | PRN
  Administered 2015-06-20: 5 mg via INTRAVENOUS

## 2015-06-20 MED ORDER — BUPIVACAINE-EPINEPHRINE 0.25% -1:200000 IJ SOLN
INTRAMUSCULAR | Status: DC | PRN
  Administered 2015-06-20: 30 mL

## 2015-06-20 MED ORDER — BUPIVACAINE IN DEXTROSE 0.75-8.25 % IT SOLN
INTRATHECAL | Status: DC | PRN
  Administered 2015-06-20: 1.6 mL via INTRATHECAL

## 2015-06-20 MED ORDER — LIDOCAINE HCL (CARDIAC) 20 MG/ML IV SOLN
INTRAVENOUS | Status: AC
Start: 2015-06-20 — End: 2015-06-20
  Filled 2015-06-20: qty 5

## 2015-06-20 MED ORDER — OXYCODONE HCL 5 MG PO TABS
5.0000 mg | ORAL_TABLET | ORAL | Status: DC | PRN
  Administered 2015-06-20: 10 mg via ORAL
  Administered 2015-06-21 (×4): 5 mg via ORAL
  Filled 2015-06-20: qty 1
  Filled 2015-06-20: qty 2
  Filled 2015-06-20 (×3): qty 1

## 2015-06-20 MED ORDER — APIXABAN 2.5 MG PO TABS
2.5000 mg | ORAL_TABLET | Freq: Two times a day (BID) | ORAL | Status: DC
  Administered 2015-06-21: 2.5 mg via ORAL
  Filled 2015-06-20: qty 1

## 2015-06-20 MED ORDER — DEXAMETHASONE SODIUM PHOSPHATE 4 MG/ML IJ SOLN
INTRAMUSCULAR | Status: AC
Start: 2015-06-20 — End: 2015-06-20
  Filled 2015-06-20: qty 1

## 2015-06-20 MED ORDER — ROCURONIUM BROMIDE 50 MG/5ML IV SOLN
INTRAVENOUS | Status: AC
  Filled 2015-06-20: qty 1

## 2015-06-20 MED ORDER — ACETAMINOPHEN 650 MG RE SUPP: 650.0000 mg | Freq: Four times a day (QID) | RECTAL | Status: DC | PRN

## 2015-06-20 MED ORDER — FENTANYL CITRATE (PF) 250 MCG/5ML IJ SOLN
INTRAMUSCULAR | Status: AC
  Filled 2015-06-20: qty 5

## 2015-06-20 MED ORDER — PHENYLEPHRINE HCL 10 MG/ML IJ SOLN
INTRAMUSCULAR | Status: DC | PRN
  Administered 2015-06-20: 80 ug via INTRAVENOUS
  Administered 2015-06-20 (×5): 40 ug via INTRAVENOUS
  Administered 2015-06-20: 80 ug via INTRAVENOUS
  Administered 2015-06-20: 40 ug via INTRAVENOUS

## 2015-06-20 MED ORDER — ONDANSETRON HCL 4 MG/2ML IJ SOLN
4.0000 mg | Freq: Four times a day (QID) | INTRAMUSCULAR | Status: DC | PRN
  Administered 2015-06-20: 4 mg via INTRAVENOUS
  Filled 2015-06-20: qty 2

## 2015-06-20 MED ORDER — METOCLOPRAMIDE HCL 5 MG/ML IJ SOLN: 5.0000 mg | Freq: Three times a day (TID) | INTRAMUSCULAR | Status: DC | PRN

## 2015-06-20 MED ORDER — PROPOFOL 10 MG/ML IV BOLUS
INTRAVENOUS | Status: DC | PRN
  Administered 2015-06-20: 20 mg via INTRAVENOUS

## 2015-06-20 MED ORDER — ACETAMINOPHEN 325 MG PO TABS: 650.0000 mg | ORAL_TABLET | Freq: Four times a day (QID) | ORAL | Status: DC | PRN

## 2015-06-20 MED ORDER — MAGNESIUM OXIDE 400 (241.3 MG) MG PO TABS
200.0000 mg | ORAL_TABLET | Freq: Every day | ORAL | Status: DC
  Administered 2015-06-21: 200 mg via ORAL
  Filled 2015-06-20: qty 1
  Filled 2015-06-20: qty 0.5

## 2015-06-20 MED ORDER — PHENOL 1.4 % MT LIQD: 1.0000 | OROMUCOSAL | Status: DC | PRN

## 2015-06-20 MED ORDER — MENTHOL 3 MG MT LOZG: 1.0000 | LOZENGE | OROMUCOSAL | Status: DC | PRN

## 2015-06-20 MED ORDER — KETOROLAC TROMETHAMINE 30 MG/ML IJ SOLN
INTRAMUSCULAR | Status: DC | PRN
  Administered 2015-06-20: 30 mg via INTRAVENOUS

## 2015-06-20 MED ORDER — TRAZODONE HCL 50 MG PO TABS
25.0000 mg | ORAL_TABLET | Freq: Every day | ORAL | Status: DC
  Administered 2015-06-20: 25 mg via ORAL
  Filled 2015-06-20: qty 1

## 2015-06-20 MED ORDER — PROPOFOL 500 MG/50ML IV EMUL
INTRAVENOUS | Status: DC | PRN
  Administered 2015-06-20 (×2): via INTRAVENOUS
  Administered 2015-06-20: 50 ug/kg/min via INTRAVENOUS

## 2015-06-20 MED ORDER — ONDANSETRON HCL 4 MG PO TABS: 4.0000 mg | ORAL_TABLET | Freq: Four times a day (QID) | ORAL | Status: DC | PRN

## 2015-06-20 MED ORDER — CELECOXIB 200 MG PO CAPS
200.0000 mg | ORAL_CAPSULE | Freq: Two times a day (BID) | ORAL | Status: DC
  Administered 2015-06-20 – 2015-06-21 (×2): 200 mg via ORAL
  Filled 2015-06-20 (×2): qty 1

## 2015-06-20 MED ORDER — DEXAMETHASONE SODIUM PHOSPHATE 10 MG/ML IJ SOLN
INTRAMUSCULAR | Status: DC | PRN
  Administered 2015-06-20: 6 mg via INTRAVENOUS
  Administered 2015-06-20: 4 mg via INTRAVENOUS

## 2015-06-20 MED ORDER — MELATONIN ER 10 MG PO TBCR: 10.0000 mg | EXTENDED_RELEASE_TABLET | Freq: Every day | ORAL | Status: DC

## 2015-06-20 MED ORDER — CEFAZOLIN SODIUM-DEXTROSE 2-3 GM-% IV SOLR
2.0000 g | Freq: Four times a day (QID) | INTRAVENOUS | Status: AC
  Administered 2015-06-20 (×2): 2 g via INTRAVENOUS
  Filled 2015-06-20 (×2): qty 50

## 2015-06-20 MED ORDER — DEXAMETHASONE SODIUM PHOSPHATE 10 MG/ML IJ SOLN
10.0000 mg | Freq: Three times a day (TID) | INTRAMUSCULAR | Status: AC
  Administered 2015-06-20 – 2015-06-21 (×3): 10 mg via INTRAVENOUS
  Filled 2015-06-20 (×3): qty 1

## 2015-06-20 MED ORDER — SODIUM CHLORIDE 0.9 % IR SOLN
Status: DC | PRN
  Administered 2015-06-20: 3000 mL

## 2015-06-20 MED ORDER — DOCUSATE SODIUM 100 MG PO CAPS
100.0000 mg | ORAL_CAPSULE | Freq: Two times a day (BID) | ORAL | Status: DC
  Administered 2015-06-20 – 2015-06-21 (×2): 100 mg via ORAL
  Filled 2015-06-20 (×2): qty 1

## 2015-06-20 MED ORDER — MIDAZOLAM HCL 5 MG/5ML IJ SOLN
INTRAMUSCULAR | Status: DC | PRN
  Administered 2015-06-20: 2 mg via INTRAVENOUS

## 2015-06-20 MED ORDER — LORATADINE 10 MG PO TABS
10.0000 mg | ORAL_TABLET | Freq: Every day | ORAL | Status: DC
  Administered 2015-06-21: 10 mg via ORAL
  Filled 2015-06-20: qty 1

## 2015-06-20 MED ORDER — SUCCINYLCHOLINE CHLORIDE 20 MG/ML IJ SOLN
INTRAMUSCULAR | Status: AC
  Filled 2015-06-20: qty 1

## 2015-06-20 MED ORDER — PROMETHAZINE HCL 25 MG/ML IJ SOLN: 6.2500 mg | INTRAMUSCULAR | Status: DC | PRN

## 2015-06-20 MED ORDER — DIPHENHYDRAMINE HCL 12.5 MG/5ML PO ELIX: 12.5000 mg | ORAL_SOLUTION | ORAL | Status: DC | PRN

## 2015-06-20 MED ORDER — METOCLOPRAMIDE HCL 5 MG PO TABS: 5.0000 mg | ORAL_TABLET | Freq: Three times a day (TID) | ORAL | Status: DC | PRN

## 2015-06-20 MED ORDER — ALUM & MAG HYDROXIDE-SIMETH 200-200-20 MG/5ML PO SUSP: 30.0000 mL | ORAL | Status: DC | PRN

## 2015-06-20 MED ORDER — POLYETHYLENE GLYCOL 3350 17 G PO PACK
17.0000 g | PACK | Freq: Two times a day (BID) | ORAL | Status: DC
  Administered 2015-06-20 – 2015-06-21 (×2): 17 g via ORAL
  Filled 2015-06-20 (×2): qty 1

## 2015-06-20 MED ORDER — ONDANSETRON HCL 4 MG/2ML IJ SOLN
INTRAMUSCULAR | Status: DC | PRN
  Administered 2015-06-20: 8 mg via INTRAVENOUS

## 2015-06-20 MED ORDER — METOPROLOL TARTRATE 1 MG/ML IV SOLN
INTRAVENOUS | Status: DC | PRN
  Administered 2015-06-20: 2 mg via INTRAVENOUS

## 2015-06-20 SURGICAL SUPPLY — 77 items
APL SKNCLS STERI-STRIP NONHPOA (GAUZE/BANDAGES/DRESSINGS) ×1
BANDAGE ESMARK 6X9 LF (GAUZE/BANDAGES/DRESSINGS) ×1 IMPLANT
BENZOIN TINCTURE PRP APPL 2/3 (GAUZE/BANDAGES/DRESSINGS) ×2 IMPLANT
BLADE SAGITTAL 25.0X1.19X90 (BLADE) ×2 IMPLANT
BLADE SAW SGTL 11.0X1.19X90.0M (BLADE) IMPLANT
BLADE SAW SGTL 13.0X1.19X90.0M (BLADE) ×2 IMPLANT
BLADE SURG 10 STRL SS (BLADE) ×4 IMPLANT
BNDG CMPR 9X6 STRL LF SNTH (GAUZE/BANDAGES/DRESSINGS) ×1
BNDG CMPR MED 15X6 ELC VLCR LF (GAUZE/BANDAGES/DRESSINGS) ×1
BNDG ELASTIC 6X15 VLCR STRL LF (GAUZE/BANDAGES/DRESSINGS) ×2 IMPLANT
BNDG ESMARK 6X9 LF (GAUZE/BANDAGES/DRESSINGS) ×2
BOWL SMART MIX CTS (DISPOSABLE) ×2 IMPLANT
CAPT KNEE TOTAL 3 ATTUNE ×1 IMPLANT
CATH FOLEY LATEX FREE 16FR (CATHETERS) ×2
CATH FOLEY LF 16FR (CATHETERS) IMPLANT
CEMENT HV SMART SET (Cement) ×4 IMPLANT
CLSR STERI-STRIP ANTIMIC 1/2X4 (GAUZE/BANDAGES/DRESSINGS) ×1 IMPLANT
COVER SURGICAL LIGHT HANDLE (MISCELLANEOUS) ×2 IMPLANT
CUFF TOURNIQUET SINGLE 34IN LL (TOURNIQUET CUFF) ×2 IMPLANT
DECANTER SPIKE VIAL GLASS SM (MISCELLANEOUS) ×2 IMPLANT
DRAPE EXTREMITY T 121X128X90 (DRAPE) ×2 IMPLANT
DRAPE INCISE IOBAN 66X45 STRL (DRAPES) ×2 IMPLANT
DRAPE PROXIMA HALF (DRAPES) ×2 IMPLANT
DRAPE U-SHAPE 47X51 STRL (DRAPES) ×2 IMPLANT
DRSG AQUACEL AG ADV 3.5X14 (GAUZE/BANDAGES/DRESSINGS) ×2 IMPLANT
DRSG PAD ABDOMINAL 8X10 ST (GAUZE/BANDAGES/DRESSINGS) ×4 IMPLANT
DURAPREP 26ML APPLICATOR (WOUND CARE) ×4 IMPLANT
ELECT CAUTERY BLADE 6.4 (BLADE) ×2 IMPLANT
ELECT REM PT RETURN 9FT ADLT (ELECTROSURGICAL) ×2
ELECTRODE REM PT RTRN 9FT ADLT (ELECTROSURGICAL) ×1 IMPLANT
EVACUATOR 1/8 PVC DRAIN (DRAIN) ×2 IMPLANT
FACESHIELD WRAPAROUND (MASK) ×2 IMPLANT
FACESHIELD WRAPAROUND OR TEAM (MASK) ×1 IMPLANT
GAUZE SPONGE 4X4 12PLY STRL (GAUZE/BANDAGES/DRESSINGS) ×2 IMPLANT
GLOVE BIO SURGEON STRL SZ7 (GLOVE) ×2 IMPLANT
GLOVE BIOGEL PI IND STRL 6.5 (GLOVE) IMPLANT
GLOVE BIOGEL PI IND STRL 7.0 (GLOVE) ×1 IMPLANT
GLOVE BIOGEL PI IND STRL 7.5 (GLOVE) ×1 IMPLANT
GLOVE BIOGEL PI INDICATOR 6.5 (GLOVE) ×6
GLOVE BIOGEL PI INDICATOR 7.0 (GLOVE) ×1
GLOVE BIOGEL PI INDICATOR 7.5 (GLOVE) ×1
GLOVE SS BIOGEL STRL SZ 7.5 (GLOVE) ×1 IMPLANT
GLOVE SUPERSENSE BIOGEL SZ 7.5 (GLOVE) ×1
GOWN STRL REUS W/ TWL LRG LVL3 (GOWN DISPOSABLE) ×1 IMPLANT
GOWN STRL REUS W/ TWL XL LVL3 (GOWN DISPOSABLE) ×2 IMPLANT
GOWN STRL REUS W/TWL LRG LVL3 (GOWN DISPOSABLE) ×8
GOWN STRL REUS W/TWL XL LVL3 (GOWN DISPOSABLE) ×4
HANDPIECE INTERPULSE COAX TIP (DISPOSABLE) ×2
HOOD PEEL AWAY FACE SHEILD DIS (HOOD) ×4 IMPLANT
IMMOBILIZER KNEE 22 UNIV (SOFTGOODS) ×2 IMPLANT
KIT BASIN OR (CUSTOM PROCEDURE TRAY) ×2 IMPLANT
KIT ROOM TURNOVER OR (KITS) ×2 IMPLANT
MANIFOLD NEPTUNE II (INSTRUMENTS) ×2 IMPLANT
MARKER SKIN DUAL TIP RULER LAB (MISCELLANEOUS) ×2 IMPLANT
NS IRRIG 1000ML POUR BTL (IV SOLUTION) ×2 IMPLANT
PACK TOTAL JOINT (CUSTOM PROCEDURE TRAY) ×2 IMPLANT
PACK UNIVERSAL I (CUSTOM PROCEDURE TRAY) ×2 IMPLANT
PAD ARMBOARD 7.5X6 YLW CONV (MISCELLANEOUS) ×4 IMPLANT
PADDING CAST COTTON 6X4 STRL (CAST SUPPLIES) ×2 IMPLANT
RUBBERBAND STERILE (MISCELLANEOUS) ×2 IMPLANT
SET HNDPC FAN SPRY TIP SCT (DISPOSABLE) ×1 IMPLANT
SPONGE GAUZE 4X4 12PLY STER LF (GAUZE/BANDAGES/DRESSINGS) ×1 IMPLANT
STRIP CLOSURE SKIN 1/2X4 (GAUZE/BANDAGES/DRESSINGS) ×2 IMPLANT
SUCTION FRAZIER TIP 10 FR DISP (SUCTIONS) ×2 IMPLANT
SUT MNCRL AB 3-0 PS2 18 (SUTURE) ×2 IMPLANT
SUT VIC AB 0 CT1 27 (SUTURE) ×4
SUT VIC AB 0 CT1 27XBRD ANBCTR (SUTURE) ×2 IMPLANT
SUT VIC AB 1 CT1 27 (SUTURE) ×2
SUT VIC AB 1 CT1 27XBRD ANBCTR (SUTURE) ×1 IMPLANT
SUT VIC AB 2-0 CT1 27 (SUTURE) ×4
SUT VIC AB 2-0 CT1 TAPERPNT 27 (SUTURE) ×2 IMPLANT
SYR 30ML SLIP (SYRINGE) ×2 IMPLANT
TOWEL OR 17X24 6PK STRL BLUE (TOWEL DISPOSABLE) ×2 IMPLANT
TOWEL OR 17X26 10 PK STRL BLUE (TOWEL DISPOSABLE) ×2 IMPLANT
TRAY FOLEY CATH 16FR SILVER (SET/KITS/TRAYS/PACK) ×1 IMPLANT
TUBE CONNECTING 12X1/4 (SUCTIONS) ×2 IMPLANT
YANKAUER SUCT BULB TIP NO VENT (SUCTIONS) ×2 IMPLANT

## 2015-06-20 NOTE — Transfer of Care (Signed)
Immediate Anesthesia Transfer of Care Note  Patient: Emily Ballard  Procedure(s) Performed: Procedure(s): TOTAL KNEE ARTHROPLASTY (Left)  Patient Location: PACU  Anesthesia Type:Spinal  Level of Consciousness: awake, alert , oriented and patient cooperative  Airway & Oxygen Therapy: Patient Spontanous Breathing  Post-op Assessment: Report given to RN and Post -op Vital signs reviewed and stable  Post vital signs: Reviewed and stable  Last Vitals:  Filed Vitals:   06/20/15 0613  BP: 134/89  Pulse: 93  Temp: 36.8 C  Resp: 20    Complications: No apparent anesthesia complications

## 2015-06-20 NOTE — Progress Notes (Signed)
Orthopedic Tech Progress Note Patient Details:  SARRAH FIORENZA 04-07-1961 [id number] On cpm at 7:15 pm Patient ID: BARBI KUMAGAI, female   DOB: 10/12/1960, 54 y.o.   MRN: [id number]   Braulio Bosch 06/20/2015, 7:19 PM

## 2015-06-20 NOTE — Progress Notes (Signed)
Orthopedic Tech Progress Note Patient Details:  Emily Ballard 08-24-1961 [id number]  CPM Left Knee CPM Left Knee: On Left Knee Flexion (Degrees): 90 Left Knee Extension (Degrees): 0 Additional Comments: trapeze bar patient helper Viewed order from doctor's order list  Hildred Priest 06/20/2015, 9:41 AM

## 2015-06-20 NOTE — Anesthesia Preprocedure Evaluation (Addendum)
Anesthesia Evaluation  Patient identified by MRN, date of birth, ID band Patient awake    Reviewed: Allergy & Precautions, NPO status , Patient's Chart, lab work & pertinent test results  History of Anesthesia Complications (+) PONV  Airway Mallampati: II  TM Distance: >3 FB Neck ROM: Full    Dental no notable dental hx.    Pulmonary neg pulmonary ROS, former smoker,    Pulmonary exam normal breath sounds clear to auscultation       Cardiovascular negative cardio ROS Normal cardiovascular exam Rhythm:Regular Rate:Normal     Neuro/Psych negative neurological ROS  negative psych ROS   GI/Hepatic Neg liver ROS, GERD  Medicated,  Endo/Other  negative endocrine ROS  Renal/GU negative Renal ROS  negative genitourinary   Musculoskeletal negative musculoskeletal ROS (+)   Abdominal   Peds negative pediatric ROS (+)  Hematology negative hematology ROS (+)   Anesthesia Other Findings   Reproductive/Obstetrics negative OB ROS                            Anesthesia Physical Anesthesia Plan  ASA: II  Anesthesia Plan: Spinal   Post-op Pain Management:    Induction: Intravenous  Airway Management Planned: Nasal Cannula  Additional Equipment:   Intra-op Plan:   Post-operative Plan:   Informed Consent: I have reviewed the patients History and Physical, chart, labs and discussed the procedure including the risks, benefits and alternatives for the proposed anesthesia with the patient or authorized representative who has indicated his/her understanding and acceptance.   Dental advisory given  Plan Discussed with: CRNA and Surgeon  Anesthesia Plan Comments:         Anesthesia Quick Evaluation

## 2015-06-20 NOTE — Op Note (Signed)
MRN:     [id number] DOB/AGE:    01-Dec-1960 / 54 y.o.       OPERATIVE REPORT    DATE OF PROCEDURE:  06/20/2015       PREOPERATIVE DIAGNOSIS:   PRIMARY LOCALIZED OA LEFT KNEE      Estimated body mass index is 25.97 kg/(m^2) as calculated from the following:   Height as of this encounter: 5' (1.524 m).   Weight as of this encounter: 60.328 kg (133 lb).                                                        POSTOPERATIVE DIAGNOSIS:   SAME                                                                     PROCEDURE:  Procedure(s): TOTAL KNEE ARTHROPLASTY Using Depuy Attune RP implants #3 Femur, #3Tibia, 30mm sigma RP bearing, 29 Patella     SURGEON: Jadae Steinke A    ASSISTANT:  Kirstin Shepperson PA-C   (Present and scrubbed throughout the case, critical for assistance with exposure, retraction, instrumentation, and closure.)         ANESTHESIA: GET with Femoral Nerve Block  DRAINS: foley, 2 medium hemovac in knee   TOURNIQUET TIME: 51ZGY   COMPLICATIONS:  None     SPECIMENS: None   INDICATIONS FOR PROCEDURE: The patient has  DJD LEFT KNEE, varus deformities, XR shows bone on bone arthritis. Patient has failed all conservative measures including anti-inflammatory medicines, narcotics, attempts at  exercise and weight loss, cortisone injections and viscosupplementation.  Risks and benefits of surgery have been discussed, questions answered.   DESCRIPTION OF PROCEDURE: The patient identified by armband, received  right femoral nerve block and IV antibiotics, in the holding area at [facility]. Patient taken to the operating room, appropriate anesthetic  monitors were attached General endotracheal anesthesia induced with  the patient in supine position, Foley catheter was inserted. Tourniquet  applied high to the operative thigh. Lateral post and foot positioner  applied to the table, the lower extremity was then prepped and draped  in usual sterile fashion from the ankle to  the tourniquet. Time-out procedure was performed. The limb was wrapped with an Esmarch bandage and the tourniquet inflated to 365 mmHg. We began the operation by making the anterior midline incision starting at handbreadth above the patella going over the patella 1 cm medial to and  4 cm distal to the tibial tubercle. Small bleeders in the skin and the  subcutaneous tissue identified and cauterized. Transverse retinaculum was incised and reflected medially and a medial parapatellar arthrotomy was accomplished. the patella was everted and theprepatellar fat pad resected. The superficial medial collateral  ligament was then elevated from anterior to posterior along the proximal  flare of the tibia and anterior half of the menisci resected. The knee was hyperflexed exposing bone on bone arthritis. Peripheral and notch osteophytes as well as the cruciate ligaments were then resected. We continued to  work our way around posteriorly along the proximal tibia, and externally  rotated  the tibia subluxing it out from underneath the femur. A McHale  retractor was placed through the notch and a lateral Hohmann retractor  placed, and we then drilled through the proximal tibia in line with the  axis of the tibia followed by an intramedullary guide rod and 2-degree  posterior slope cutting guide. The tibial cutting guide was pinned into place  allowing resection of 4 mm of bone medially and about 6 mm of bone  laterally because of her varus deformity. Satisfied with the tibial resection, we then  entered the distal femur 2 mm anterior to the PCL origin with the  intramedullary guide rod and applied the distal femoral cutting guide  set at 2mm, with 5 degrees of valgus. This was pinned along the  epicondylar axis. At this point, the distal femoral cut was accomplished without difficulty. We then sized for a #3 femoral component and pinned the guide in 3 degrees of external rotation.The chamfer cutting guide was  pinned into place. The anterior, posterior, and chamfer cuts were accomplished without difficulty followed by  the Attune RP box cutting guide and the box cut. We also removed posterior osteophytes from the posterior femoral condyles. At this  time, the knee was brought into full extension. We checked our  extension and flexion gaps and found them symmetric at 11mm.  The patella thickness measured at 20 mm. We set the cutting guide at 13 and removed the posterior 8 mm  of the patella sized for 29 button and drilled the lollipop. The knee  was then once again hyperflexed exposing the proximal tibia. We sized for a #3 tibial base plate, applied the smokestack and the conical reamer followed by the the Delta fin keel punch. We then hammered into place the Attune RP trial femoral component, inserted a 5-mm trial bearing, trial patellar button, and took the knee through range of motion from 0-130 degrees. No thumb pressure was required for patellar  tracking. At this point, all trial components were removed, a double batch of DePuy HV cement  was mixed and applied to all bony metallic mating surfaces except for the posterior condyles of the femur itself. In order, we  hammered into place the tibial tray and removed excess cement, the femoral component and removed excess cement, a 5-mm Attune RP bearing  was inserted, and the knee brought to full extension with compression.  The patellar button was clamped into place, and excess cement  removed. While the cement cured the wound was irrigated out with normal saline solution pulse lavage, and medium Hemovac drains were placed.. Ligament stability and patellar tracking were checked and found to be excellent. The tourniquet was then released and hemostasis was obtained with cautery. The parapatellar arthrotomy was closed with  #1 ethibond suture. The subcutaneous tissue with 0 and 2-0 undyed  Vicryl suture, and 4-0 Monocryl.. A dressing of Xeroform,  4 x 4,  dressing sponges, Webril, and Ace wrap applied. Needle and sponge count were correct times 2.The patient awakened, extubated, and taken to recovery room without difficulty. Vascular status was normal, pulses 2+ and symmetric.   Emily Ballard A 06/20/2015, 8:49 AM

## 2015-06-20 NOTE — Anesthesia Procedure Notes (Addendum)
Spinal Patient location during procedure: OR Staffing Performed by: anesthesiologist  Preanesthetic Checklist Completed: patient identified, site marked, surgical consent, pre-op evaluation, timeout performed, IV checked, risks and benefits discussed and monitors and equipment checked Spinal Block Patient position: sitting Prep: Betadine Patient monitoring: heart rate, continuous pulse ox and blood pressure Injection technique: single-shot Needle Needle type: Sprotte  Needle gauge: 24 G Needle length: 9 cm Additional Notes Expiration date of kit checked and confirmed. Patient tolerated procedure well, without complications.    Procedure Name: MAC Date/Time: 06/20/2015 7:10 AM Performed by: Duke Salvia Pre-anesthesia Checklist: Patient identified, Emergency Drugs available, Suction available, Patient being monitored and Timeout performed Patient Re-evaluated:Patient Re-evaluated prior to inductionOxygen Delivery Method: Non-rebreather mask Intubation Type: IV induction

## 2015-06-20 NOTE — Interval H&P Note (Signed)
History and Physical Interval Note:  06/20/2015 7:04 AM  Emily Ballard  has presented today for surgery, with the diagnosis of PRIMARY LOCALIZED OA LEFT KNEE  The various methods of treatment have been discussed with the patient and family. After consideration of risks, benefits and other options for treatment, the patient has consented to  Procedure(s): TOTAL KNEE ARTHROPLASTY (Left) as a surgical intervention .  The patient's history has been reviewed, patient examined, no change in status, stable for surgery.  I have reviewed the patient's chart and labs.  Questions were answered to the patient's satisfaction.     Elsie Saas A

## 2015-06-20 NOTE — Progress Notes (Signed)
Physical Therapy Treatment Patient Details Name: Emily Ballard MRN: [id number] DOB: 01-23-1961 Today's Date: 06/20/2015    History of Present Illness 54 y.o. female now s/p Lt TKA.     PT Comments    Patient able to ambulate 8 feet with min guard assistance. Patient with decreased reports of nausea and dizziness during second session. Will continue to progress mobility as tolerated for anticipated D/C to home with family assistance.   Follow Up Recommendations  Home health PT;Supervision for mobility/OOB     Equipment Recommendations  None recommended by PT    Recommendations for Other Services       Precautions / Restrictions Precautions Precautions: Knee;Fall Precaution Booklet Issued: Yes (comment) Precaution Comments: HEP provided, no pillow behind knee reviewed.  Required Braces or Orthoses: Knee Immobilizer - Left Restrictions Weight Bearing Restrictions: Yes LLE Weight Bearing: Weight bearing as tolerated    Mobility  Bed Mobility Overal bed mobility: Needs Assistance Bed Mobility: Supine to Sit     Supine to sit: Min guard    General bed mobility comments: HOB elevated, mild dizziness with initial sitting  Transfers Overall transfer level: Needs assistance Equipment used: Rolling walker (2 wheeled) Transfers: Sit to/from Stand Sit to Stand: Min guard         General transfer comment: cues for hand placement  Ambulation/Gait Ambulation/Gait assistance: Min guard Ambulation Distance (Feet): 8 Feet Assistive device: Rolling walker (2 wheeled) Gait Pattern/deviations: Step-to pattern;Decreased weight shift to left;Decreased stance time - left Gait velocity: decreased   General Gait Details: cues for gait sequence.    Stairs            Wheelchair Mobility    Modified Rankin (Stroke Patients Only)       Balance Overall balance assessment: Needs assistance Sitting-balance support: No upper extremity supported Sitting balance-Leahy  Scale: Good     Standing balance support: Bilateral upper extremity supported Standing balance-Leahy Scale: Poor Standing balance comment: using rw                    Cognition Arousal/Alertness: Awake/alert Behavior During Therapy: WFL for tasks assessed/performed Overall Cognitive Status: Within Functional Limits for tasks assessed                      Exercises      General Comments        Pertinent Vitals/Pain Pain Assessment: Faces Pain Score: 2  Faces Pain Scale: Hurts even more Pain Location: Lt knee Pain Descriptors / Indicators: Aching Pain Intervention(s): Limited activity within patient's tolerance;Monitored during session    Home Living      Prior Function          PT Goals (current goals can now be found in the care plan section) Acute Rehab PT Goals Patient Stated Goal: go home from the hospital PT Goal Formulation: With patient Time For Goal Achievement: 07/04/15 Potential to Achieve Goals: Good Progress towards PT goals: Progressing toward goals    Frequency  7X/week    PT Plan Current plan remains appropriate    Co-evaluation             End of Session Equipment Utilized During Treatment: Left knee immobilizer;Gait belt Activity Tolerance: Patient tolerated treatment well Patient left: in chair;with call bell/phone within reach;with family/visitor present;Other (comment) (in bone foam)     Time: 1191-4782 PT Time Calculation (min) (ACUTE ONLY): 14 min  Charges:   $Gait Training: 8-22 mins  G Codes:      Cassell Clement, PT, CSCS Pager [phone] Office [phone]  06/20/2015, 4:19 PM

## 2015-06-20 NOTE — Progress Notes (Signed)
Utilization review completed.  

## 2015-06-20 NOTE — Evaluation (Signed)
Physical Therapy Evaluation Patient Details Name: Emily Ballard MRN: [id number] DOB: 12-Feb-1961 Today's Date: 06/20/2015   History of Present Illness  54 y.o. female now s/p Lt TKA.   Clinical Impression  Pt is s/p TKA resulting in the deficits listed below (see PT Problem List). Pt will benefit from skilled PT to increase their independence and safety with mobility to allow discharge to the venue listed below. Able to sit edge of bed at this time, patient with mild nausea and dizziness. Anticipate patient will D/C to home with family assistance.      Follow Up Recommendations Home health PT;Supervision for mobility/OOB    Equipment Recommendations  None recommended by PT;Other (comment) (patient reports having rw at home)    Recommendations for Other Services       Precautions / Restrictions Precautions Precautions: Knee;Fall Precaution Booklet Issued: Yes (comment) Precaution Comments: HEP provided, no pillow behind knee reviewed.  Required Braces or Orthoses: Knee Immobilizer - Left Restrictions Weight Bearing Restrictions: Yes LLE Weight Bearing: Weight bearing as tolerated      Mobility  Bed Mobility Overal bed mobility: Needs Assistance Bed Mobility: Supine to Sit;Sit to Supine     Supine to sit: Mod assist Sit to supine: Mod assist   General bed mobility comments: assist with LLE  Transfers                 General transfer comment: unable to attempt, still numb from block.   Ambulation/Gait                Stairs            Wheelchair Mobility    Modified Rankin (Stroke Patients Only)       Balance Overall balance assessment: Needs assistance Sitting-balance support: Single extremity supported;No upper extremity supported Sitting balance-Leahy Scale: Fair                                       Pertinent Vitals/Pain Pain Assessment: 0-10 Pain Score: 2  Pain Location: Lt knee Pain Descriptors / Indicators:  Aching Pain Intervention(s): Limited activity within patient's tolerance;Monitored during session    Home Living Family/patient expects to be discharged to:: Private residence Living Arrangements: Spouse/significant other Available Help at Discharge: Family Type of Home: House Home Access: Stairs to enter Entrance Stairs-Rails: None Technical brewer of Steps: 1 Home Layout: Able to live on main level with bedroom/bathroom Home Equipment: Environmental consultant - 2 wheels      Prior Function Level of Independence: Independent               Hand Dominance        Extremity/Trunk Assessment   Upper Extremity Assessment: Defer to OT evaluation           Lower Extremity Assessment: LLE deficits/detail   LLE Deficits / Details: able to perform SLR     Communication   Communication: No difficulties  Cognition Arousal/Alertness: Awake/alert Behavior During Therapy: WFL for tasks assessed/performed Overall Cognitive Status: Within Functional Limits for tasks assessed                      General Comments      Exercises        Assessment/Plan    PT Assessment Patient needs continued PT services  PT Diagnosis Difficulty walking   PT Problem List Decreased strength;Decreased range of motion;Decreased balance;Decreased activity  tolerance;Decreased mobility  PT Treatment Interventions DME instruction;Gait training;Stair training;Functional mobility training;Therapeutic activities;Therapeutic exercise;Balance training;Patient/family education   PT Goals (Current goals can be found in the Care Plan section) Acute Rehab PT Goals Patient Stated Goal: go home from the hospital PT Goal Formulation: With patient Time For Goal Achievement: 07/04/15 Potential to Achieve Goals: Good    Frequency 7X/week   Barriers to discharge        Co-evaluation               End of Session Equipment Utilized During Treatment: Left knee immobilizer Activity Tolerance:  Other (comment) (mild nausea and dizziness when sitting edge of bed. ) Patient left: in bed;with call bell/phone within reach;with family/visitor present;with nursing/sitter in room;Other (comment) (in bone foam) Nurse Communication: Mobility status         Time: 1136-1209 PT Time Calculation (min) (ACUTE ONLY): 33 min   Charges:   PT Evaluation $Initial PT Evaluation Tier I: 1 Procedure PT Treatments $Therapeutic Activity: 8-22 mins   PT G Codes:        Cassell Clement, PT, CSCS Pager [phone] Office [phone]  06/20/2015, 12:39 PM

## 2015-06-20 NOTE — Progress Notes (Signed)
After MD reviewed EKG, pt. Was asked to get EKG with PCP, which she did last week & was told that it was normal.

## 2015-06-20 NOTE — Anesthesia Postprocedure Evaluation (Signed)
  Anesthesia Post-op Note  Patient: Emily Ballard  Procedure(s) Performed: Procedure(s) (LRB): TOTAL KNEE ARTHROPLASTY (Left)  Patient Location: PACU  Anesthesia Type: Spinal  Level of Consciousness: awake and alert   Airway and Oxygen Therapy: Patient Spontanous Breathing  Post-op Pain: mild  Post-op Assessment: Post-op Vital signs reviewed, Patient's Cardiovascular Status Stable, Respiratory Function Stable, Patent Airway and No signs of Nausea or vomiting  Last Vitals:  Filed Vitals:   06/20/15 0930  BP: 134/72  Pulse: 99  Temp:   Resp: 14    Post-op Vital Signs: stable   Complications: No apparent anesthesia complications

## 2015-06-21 ENCOUNTER — Encounter (HOSPITAL_COMMUNITY): Payer: Self-pay | Admitting: Orthopedic Surgery

## 2015-06-21 LAB — BASIC METABOLIC PANEL
ANION GAP: 6 (ref 5–15)
BUN: 9 mg/dL (ref 6–20)
CO2: 25 mmol/L (ref 22–32)
Calcium: 7.9 mg/dL — ABNORMAL LOW (ref 8.9–10.3)
Chloride: 99 mmol/L — ABNORMAL LOW (ref 101–111)
Creatinine, Ser: 0.85 mg/dL (ref 0.44–1.00)
Glucose, Bld: 141 mg/dL — ABNORMAL HIGH (ref 65–99)
POTASSIUM: 4.2 mmol/L (ref 3.5–5.1)
SODIUM: 130 mmol/L — AB (ref 135–145)

## 2015-06-21 LAB — CBC
HCT: 33.7 % — ABNORMAL LOW (ref 36.0–46.0)
Hemoglobin: 11.2 g/dL — ABNORMAL LOW (ref 12.0–15.0)
MCH: 30.1 pg (ref 26.0–34.0)
MCHC: 33.2 g/dL (ref 30.0–36.0)
MCV: 90.6 fL (ref 78.0–100.0)
Platelets: 263 10*3/uL (ref 150–400)
RBC: 3.72 MIL/uL — AB (ref 3.87–5.11)
RDW: 12 % (ref 11.5–15.5)
WBC: 15.2 10*3/uL — AB (ref 4.0–10.5)

## 2015-06-21 MED ORDER — OXYCODONE HCL ER 10 MG PO T12A
20.0000 mg | EXTENDED_RELEASE_TABLET | Freq: Two times a day (BID) | ORAL | Status: DC
  Administered 2015-06-21: 20 mg via ORAL
  Filled 2015-06-21: qty 2

## 2015-06-21 MED ORDER — APIXABAN 2.5 MG PO TABS: 2.5000 mg | ORAL_TABLET | Freq: Two times a day (BID) | ORAL | Status: DC

## 2015-06-21 MED ORDER — ACETAMINOPHEN 325 MG PO TABS: 650.0000 mg | ORAL_TABLET | Freq: Four times a day (QID) | ORAL | Status: DC | PRN

## 2015-06-21 MED ORDER — POLYETHYLENE GLYCOL 3350 17 G PO PACK: PACK | ORAL | Status: DC

## 2015-06-21 MED ORDER — OXYCODONE HCL 5 MG PO TABS: ORAL_TABLET | ORAL | Status: DC

## 2015-06-21 MED ORDER — DOCUSATE SODIUM 100 MG PO CAPS: ORAL_CAPSULE | ORAL | Status: DC

## 2015-06-21 MED ORDER — OXYCODONE HCL ER 20 MG PO T12A: EXTENDED_RELEASE_TABLET | ORAL | Status: DC

## 2015-06-21 NOTE — Care Management Note (Signed)
Case Management Note  Patient Details  Name: Emily Ballard MRN: [id number] Date of Birth: February 15, 1961  Subjective/Objective:  54 yr old female s/p right total knee arthroplasty.                   Action/Plan:  Case manager spoke with patient concerning home health and DME needs at discharge. Patient was preoperatively setup with [facility], no changes. She will have family support at discharge. Patient stated that her rolling walker, and CPM have been delivered to her home. She has chair height toilets and states she will not need 3in1.06/22/15     ted Discharge Date:                  Expected Discharge Plan:  [place]  In-House Referral:     Discharge planning Services  CM Consult  Post Acute Care Choice:  Durable Medical Equipment Choice offered to:  Patient  DME Arranged:  3-N-1, CPM, Walker rolling DME Agency:  [employer]  HH Arranged:  PT [place] Agency:  [facility]  Status of Service:  Completed, signed off  Medicare Important Message Given:    Date Medicare IM Given:    Medicare IM give by:    Date Additional Medicare IM Given:    Additional Medicare Important Message give by:     If discussed at [place] of Stay Meetings, dates discussed:    Additional Comments:  Ninfa Meeker, RN 06/21/2015, 10:52 AM

## 2015-06-21 NOTE — Progress Notes (Signed)
Patient progressing well with PT sessions. Able to perform step and ambulation without instability or loss of balance. Patient reports feeling confident with current mobility level. Patient's friend and spouse present for session.    06/21/15 1419  PT Visit Information  Last PT Received On 06/21/15  Assistance Needed +1  History of Present Illness 54 y.o. female now s/p Lt TKA.   PT Time Calculation  PT Start Time (ACUTE ONLY) 1308  PT Stop Time (ACUTE ONLY) 1352  PT Time Calculation (min) (ACUTE ONLY) 44 min  Subjective Data  Subjective Still sore but better  Patient Stated Goal go home today  Precautions  Precautions Knee  Restrictions  Weight Bearing Restrictions Yes  LLE Weight Bearing WBAT  Pain Assessment  Pain Assessment 0-10  Pain Score 4  Pain Location lt knee  Pain Descriptors / Indicators Aching  Pain Intervention(s) Limited activity within patient's tolerance;Monitored during session  Cognition  Arousal/Alertness Awake/alert  Behavior During Therapy WFL for tasks assessed/performed  Overall Cognitive Status Within Functional Limits for tasks assessed  Bed Mobility  Overal bed mobility Needs Assistance  Bed Mobility Supine to Sit  Supine to sit Modified independent (Device/Increase time)  General bed mobility comments HOB flat, no rail  Transfers  Overall transfer level Needs assistance  Equipment used Rolling walker (2 wheeled)  Transfers Sit to/from Stand  Sit to Stand Supervision  General transfer comment no cues needed, supervision for safety   Ambulation/Gait  Ambulation/Gait assistance Supervision  Ambulation Distance (Feet) 100 Feet  Assistive device Rolling walker (2 wheeled)  Gait Pattern/deviations Decreased stance time - left;Decreased weight shift to left  General Gait Details cues for knee flexion with swing phase and steady gait with decreased pause between strides.   Gait velocity decreased  Stairs Yes  Stairs assistance Min guard  Stair  Management No rails;Backwards;With walker  Number of Stairs 1  General stair comments Patient reports feeling confident, verbal review of stairs with rail, declines attempting  Balance  Overall balance assessment Needs assistance  Sitting-balance support No upper extremity supported  Sitting balance-Leahy Scale Good  Standing balance support During functional activity  Standing balance-Leahy Scale Fair  Standing balance comment using rw  Total Joint Exercises  Ankle Circles/Pumps AROM;Both;10 reps  Quad Sets Strengthening;Left;10 reps  Towel Squeeze Strengthening;Both;10 reps  Short Arc Quad Strengthening;Left;10 reps  Heel Slides AAROM;Left;10 reps  Hip ABduction/ADduction Strengthening;Left;10 reps  Straight Leg Raises Strengthening;Left;10 reps (min assist)  Long Arc Quad Strengthening;Left;10 reps  Knee Flexion AAROM;Left;Seated  PT - End of Session  Equipment Utilized During Treatment Gait belt  Activity Tolerance Patient tolerated treatment well  Patient left in chair;with call bell/phone within reach;with family/visitor present;Other (comment) (in bone foam)  Nurse Communication Mobility status;Weight bearing status  PT - Assessment/Plan  PT Plan Current plan remains appropriate  PT Frequency (ACUTE ONLY) 7X/week  Follow Up Recommendations Home health PT;Supervision for mobility/OOB  PT equipment None recommended by PT  PT Goal Progression  Progress towards PT goals Progressing toward goals  Acute Rehab PT Goals  PT Goal Formulation With patient  Time For Goal Achievement 07/04/15  Potential to Achieve Goals Good  PT General Charges  $$ ACUTE PT VISIT 1 Procedure  PT Treatments  $Gait Training 23-37 mins  $Therapeutic Exercise 8-22 mins   Cassell Clement, PT, CSCS Pager [phone] Office [phone]

## 2015-06-21 NOTE — Evaluation (Signed)
Occupational Therapy Evaluation Patient Details Name: Emily Ballard MRN: [id number] DOB: April 22, 1961 Today's Date: 06/21/2015    History of Present Illness 54 y.o. female now s/p Lt TKA.    Clinical Impression   Pt reports she was independent with ADLs PTA. All education complete, pt and family with no further questions or concerns for OT at this time. Pt plan to d/c home with 24/7 supervision from family. At this time pt ready to d/c home from an OT standpoint, signing off at this time. Thank you for this referral.     Follow Up Recommendations  No OT follow up;Supervision - Intermittent    Equipment Recommendations  None recommended by OT    Recommendations for Other Services       Precautions / Restrictions Precautions Precautions: Knee;Fall Restrictions Weight Bearing Restrictions: Yes LLE Weight Bearing: Weight bearing as tolerated      Mobility Bed Mobility               General bed mobility comments: Pt in recliner, returned to recliner at end of session  Transfers Overall transfer level: Needs assistance Equipment used: Rolling walker (2 wheeled) Transfers: Sit to/from Stand Sit to Stand: Min guard         General transfer comment: Min guard for safety. Good hand placement and technique    Balance Overall balance assessment: Needs assistance Sitting-balance support: No upper extremity supported Sitting balance-Leahy Scale: Good     Standing balance support: Bilateral upper extremity supported Standing balance-Leahy Scale: Poor Standing balance comment: RW for support                            ADL Overall ADL's : Needs assistance/impaired Eating/Feeding: Set up;Sitting   Grooming: Min guard;Standing   Upper Body Bathing: Set up;Sitting   Lower Body Bathing: Min guard;Sit to/from stand   Upper Body Dressing : Set up;Sitting   Lower Body Dressing: Min guard;Sit to/from stand   Toilet Transfer: Min guard;Ambulation;Comfort  height toilet;RW       Tub/ Shower Transfer: Min guard;Walk-in shower;Ambulation;Shower seat;Rolling walker   Functional mobility during ADLs: Min guard;Rolling walker General ADL Comments: Family present during OT eval. Educated pt on compensatory strategies for LB ADLs, edema management techniques, car transfer technique, home safety, need for close supervision during ADLs and functional mobility; pt verbalized understanding. Educated on walk in shower transfer technique; pt demonstrated understanding.      Vision     Perception     Praxis      Pertinent Vitals/Pain Pain Assessment: 0-10 Pain Score: 5  Pain Location: L knee Pain Descriptors / Indicators: Aching;Sore Pain Intervention(s): Limited activity within patient's tolerance;Monitored during session;Repositioned;Ice applied     Hand Dominance     Extremity/Trunk Assessment Upper Extremity Assessment Upper Extremity Assessment: Overall WFL for tasks assessed   Lower Extremity Assessment Lower Extremity Assessment: Defer to PT evaluation   Cervical / Trunk Assessment Cervical / Trunk Assessment: Normal   Communication Communication Communication: No difficulties   Cognition Arousal/Alertness: Awake/alert Behavior During Therapy: WFL for tasks assessed/performed Overall Cognitive Status: Within Functional Limits for tasks assessed                     General Comments       Exercises       Shoulder Instructions      Home Living Family/patient expects to be discharged to:: Private residence Living Arrangements: Spouse/significant other Available Help  at Discharge: Family Type of Home: House Home Access: Stairs to enter Technical brewer of Steps: 1 Entrance Stairs-Rails: None Home Layout: Able to live on main level with bedroom/bathroom     Bathroom Shower/Tub: Occupational psychologist: Handicapped height Bathroom Accessibility: Yes How Accessible: Accessible via walker Home  Equipment: [place] - 2 wheels;Shower seat          Prior Functioning/Environment Level of Independence: Independent             OT Diagnosis: Acute pain   OT Problem List:     OT Treatment/Interventions:      OT Goals(Current goals can be found in the care plan section) Acute Rehab OT Goals Patient Stated Goal: to go home OT Goal Formulation: With patient  OT Frequency:     Barriers to D/C:            Co-evaluation              End of Session Equipment Utilized During Treatment: Rolling walker CPM Left Knee CPM Left Knee: Off  Activity Tolerance: Patient tolerated treatment well Patient left: in chair;with call bell/phone within reach;with family/visitor present;Other (comment) (zero degree bone foam applied to LLE)   Time: 0964-3838 OT Time Calculation (min): 17 min Charges:  OT General Charges $OT Visit: 1 Procedure OT Evaluation $Initial OT Evaluation Tier I: 1 Procedure G-Codes:     Binnie Kand M.S., OTR/L Pager: [phone]  06/21/2015, 9:11 AM

## 2015-06-21 NOTE — Evaluation (Signed)
Physical Therapy Evaluation Patient Details Name: Emily Ballard MRN: [id number] DOB: March 06, 1961 Today's Date: 06/21/2015   History of Present Illness  54 y.o. female now s/p Lt TKA.   Clinical Impression  Patient is making good progress with PT.  From a mobility standpoint anticipate patient will be ready for DC home with family assistance. Will plan to attempt stairs and review ambulation during afternoon session.        Follow Up Recommendations Home health PT;Supervision for mobility/OOB    Equipment Recommendations  None recommended by PT    Recommendations for Other Services       Precautions / Restrictions Precautions Precautions: Knee Restrictions Weight Bearing Restrictions: Yes LLE Weight Bearing: Weight bearing as tolerated      Mobility  Bed Mobility Overal bed mobility: Needs Assistance Bed Mobility: Sit to Supine       Sit to supine: Supervision   General bed mobility comments: up in recliner upon arrival.   Transfers Overall transfer level: Needs assistance Equipment used: Rolling walker (2 wheeled) Transfers: Sit to/from Stand Sit to Stand: Min guard         General transfer comment: no cues needed, supervision for safety   Ambulation/Gait Ambulation/Gait assistance: Min guard;Supervision Ambulation Distance (Feet): 100 Feet Assistive device: Rolling walker (2 wheeled) Gait Pattern/deviations: Step-through pattern;Decreased stance time - left;Decreased weight shift to left Gait velocity: decreased   General Gait Details: cues for Lt knee flexion with swing phase  Stairs            Wheelchair Mobility    Modified Rankin (Stroke Patients Only)       Balance Overall balance assessment: Needs assistance Sitting-balance support: No upper extremity supported Sitting balance-Leahy Scale: Good     Standing balance support: During functional activity Standing balance-Leahy Scale: Fair Standing balance comment: using rw                              Pertinent Vitals/Pain Pain Assessment: 0-10 Pain Score: 4  Pain Location: Lt knee Pain Descriptors / Indicators: Aching Pain Intervention(s): Limited activity within patient's tolerance;Monitored during session    Home Living Family/patient expects to be discharged to:: Private residence Living Arrangements: Spouse/significant other Available Help at Discharge: Family Type of Home: House Home Access: Stairs to enter Entrance Stairs-Rails: None Entrance Stairs-Number of Steps: 1 Home Layout: Able to live on main level with bedroom/bathroom Home Equipment: Walker - 2 wheels;Shower seat      Prior Function Level of Independence: Independent               Hand Dominance        Extremity/Trunk Assessment   Upper Extremity Assessment: Overall WFL for tasks assessed           Lower Extremity Assessment: Defer to PT evaluation      Cervical / Trunk Assessment: Normal  Communication   Communication: No difficulties  Cognition Arousal/Alertness: Awake/alert Behavior During Therapy: WFL for tasks assessed/performed Overall Cognitive Status: Within Functional Limits for tasks assessed                      General Comments      Exercises Total Joint Exercises Ankle Circles/Pumps: AROM;Both;10 reps Quad Sets: Strengthening;Left;10 reps Short Arc Quad: Strengthening;Left;10 reps Heel Slides: AAROM;Left;10 reps Hip ABduction/ADduction: Strengthening;Left;10 reps Straight Leg Raises: Strengthening;Left;5 reps Long Arc Quad: Strengthening;Left;5 reps Knee Flexion: AAROM;Left;Seated Goniometric ROM: 84 degrees flexion  Assessment/Plan    PT Assessment    PT Diagnosis     PT Problem List    PT Treatment Interventions     PT Goals (Current goals can be found in the Care Plan section) Acute Rehab PT Goals Patient Stated Goal: go home today PT Goal Formulation: With patient Time For Goal Achievement:  07/04/15 Potential to Achieve Goals: Good    Frequency 7X/week   Barriers to discharge        Co-evaluation               End of Session Equipment Utilized During Treatment: Gait belt Activity Tolerance: Patient tolerated treatment well Patient left: in bed;with call bell/phone within reach;with family/visitor present;in CPM Nurse Communication: Mobility status;Weight bearing status         Time: 7517-0017 PT Time Calculation (min) (ACUTE ONLY): 45 min   Charges:     PT Treatments $Gait Training: 8-22 mins $Therapeutic Exercise: 23-37 mins   PT G Codes:        Cassell Clement, PT, CSCS Pager [phone] Office [phone]  06/21/2015, 12:50 PM

## 2015-06-21 NOTE — Discharge Instructions (Signed)
Information on my medicine - ELIQUIS® (apixaban) ° °This medication education was reviewed with me or my healthcare representative as part of my discharge preparation.  The pharmacist that spoke with me during my hospital stay was:  Tenya Araque Kay, RPH ° °Why was Eliquis® prescribed for you? °Eliquis® was prescribed for you to reduce the risk of blood clots forming after orthopedic surgery.   ° °What do You need to know about Eliquis®? °Take your Eliquis® TWICE DAILY - one tablet in the morning and one tablet in the evening with or without food.  It would be best to take the dose about the same time each day. ° °If you have difficulty swallowing the tablet whole please discuss with your pharmacist how to take the medication safely. ° °Take Eliquis® exactly as prescribed by your doctor and DO NOT stop taking Eliquis® without talking to the doctor who prescribed the medication.  Stopping without other medication to take the place of Eliquis® may increase your risk of developing a clot. ° °After discharge, you should have regular check-up appointments with your healthcare provider that is prescribing your Eliquis®. ° °What do you do if you miss a dose? °If a dose of ELIQUIS® is not taken at the scheduled time, take it as soon as possible on the same day and twice-daily administration should be resumed.  The dose should not be doubled to make up for a missed dose.  Do not take more than one tablet of ELIQUIS at the same time. ° °Important Safety Information °A possible side effect of Eliquis® is bleeding. You should call your healthcare provider right away if you experience any of the following: °? Bleeding from an injury or your nose that does not stop. °? Unusual colored urine (red or dark brown) or unusual colored stools (red or black). °? Unusual bruising for unknown reasons. °? A serious fall or if you hit your head (even if there is no bleeding). ° °Some medicines may interact with Eliquis® and might increase  your risk of bleeding or clotting while on Eliquis®. To help avoid this, consult your healthcare provider or pharmacist prior to using any new prescription or non-prescription medications, including herbals, vitamins, non-steroidal anti-inflammatory drugs (NSAIDs) and supplements. ° °This website has more information on Eliquis® (apixaban): http://www.eliquis.com/eliquis/home °

## 2015-06-22 NOTE — Discharge Summary (Signed)
Patient ID: Emily Ballard MRN: [id number] DOB/AGE: 56-06-1961 56 y.o.  Admit date: 06/20/2015 Discharge date: 06/21/2015  Admission Diagnoses:  Active Problems:   Primary localized osteoarthritis of left knee   DJD (degenerative joint disease) of knee   Discharge Diagnoses:  Same  Past Medical History  Diagnosis Date  . Primary localized osteoarthritis of left knee   . PONV (postoperative nausea and vomiting)   . Pneumonia     hx of  . GERD (gastroesophageal reflux disease)     Surgeries: Procedure(s): TOTAL KNEE ARTHROPLASTY on 06/20/2015   Consultants:    Discharged Condition: Improved  Hospital Course: Emily Ballard is an 54 y.o. female who was admitted 06/20/2015 for operative treatment of<principal problem not specified>. Patient has severe unremitting pain that affects sleep, daily activities, and work/hobbies. After pre-op clearance the patient was taken to the operating room on 06/20/2015 and underwent  Procedure(s): TOTAL KNEE ARTHROPLASTY.    Patient was given perioperative antibiotics: Anti-infectives    Start     Dose/Rate Route Frequency Ordered Stop   06/20/15 1200  ceFAZolin (ANCEF) IVPB 2 g/50 mL premix     2 g 100 mL/hr over 30 Minutes Intravenous Every 6 hours 06/20/15 1034 06/20/15 1743   06/20/15 0600  ceFAZolin (ANCEF) IVPB 2 g/50 mL premix     2 g 100 mL/hr over 30 Minutes Intravenous On call to O.R. 06/19/15 1437 06/20/15 0720       Patient was given sequential compression devices, early ambulation, and chemoprophylaxis to prevent DVT.  Patient benefited maximally from hospital stay and there were no complications.    Recent vital signs: Patient Vitals for the past 24 hrs:  BP Temp Temp src Pulse Resp SpO2  06/21/15 1400 (!) 110/46 mmHg 98.2 F (36.8 C) Oral 63 18 98 %     Recent laboratory studies:  Recent Labs  06/21/15 0544  WBC 15.2*  HGB 11.2*  HCT 33.7*  PLT 263  NA 130*  K 4.2  CL 99*  CO2 25  BUN 9  CREATININE 0.85   GLUCOSE 141*  CALCIUM 7.9*     Discharge Medications:     Medication List    STOP taking these medications        meloxicam 15 MG tablet  Commonly known as:  MOBIC     MSM 1000 MG Tabs     OVER THE COUNTER MEDICATION     PREMARIN 0.9 MG tablet  Generic drug:  estrogens (conjugated)     TRIPLE FLEX PO      TAKE these medications        acetaminophen 325 MG tablet  Commonly known as:  TYLENOL  Take 2 tablets (650 mg total) by mouth every 6 (six) hours as needed for mild pain (or Fever >/= 101).     ALPRAZolam 0.5 MG tablet  Commonly known as:  XANAX  Take 0.5 mg by mouth at bedtime as needed for anxiety.     apixaban 2.5 MG Tabs tablet  Commonly known as:  ELIQUIS  Take 1 tablet (2.5 mg total) by mouth every 12 (twelve) hours.     CALCIUM & VIT D3 BONE HEALTH PO  Take 1 tablet by mouth daily.     cephALEXin 500 MG capsule  Commonly known as:  KEFLEX  Take 500 mg by mouth daily.     docusate sodium 100 MG capsule  Commonly known as:  COLACE  1 tab 2 times a day while on narcotics.  STOOL SOFTENER     fexofenadine 180 MG tablet  Commonly known as:  ALLEGRA  Take 180 mg by mouth daily.     Magnesium 250 MG Tabs  Take 250 mg by mouth daily.     Melatonin 10 MG Tbcr  Take 10 mg by mouth at bedtime.     oxyCODONE 5 MG immediate release tablet  Commonly known as:  Oxy IR/ROXICODONE  1-2 tablets every 4-6 hrs as needed for pain     OxyCODONE 20 mg T12a 12 hr tablet  Commonly known as:  OXYCONTIN  1 tablet po q 12 LONG ACTING PAIN MEDICATION     polyethylene glycol packet  Commonly known as:  MIRALAX / GLYCOLAX  17grams in 16 oz of water twice a day until bowel movement.  LAXITIVE.  Restart if two days since last bowel movement     PROBIOTIC DAILY PO  Take 1 tablet by mouth daily.     traZODone 50 MG tablet  Commonly known as:  DESYREL  Take 37.5 mg by mouth at bedtime.     VITAMIN C PO  Take 1 tablet by mouth daily.        Diagnostic  Studies: No results found.  Disposition: 01-Home or Self Care      Discharge Instructions    CPM    Complete by:  As directed   Continuous passive motion machine (CPM):      Use the CPM from 0 to 90 for 6 hours per day.       You may break it up into 2 or 3 sessions per day.      Use CPM for 2 weeks or until you are told to stop.     Call MD / Call 911    Complete by:  As directed   If you experience chest pain or shortness of breath, CALL 911 and be transported to the hospital emergency room.  If you develope a fever above 101 F, pus (white drainage) or increased drainage or redness at the wound, or calf pain, call your surgeon's office.     Change dressing    Complete by:  As directed   Change the gauze dressing daily with sterile 4 x 4 inch gauze and apply TED hose.  DO NOT REMOVE BANDAGE OVER SURGICAL INCISION.  [place] WHOLE LEG INCLUDING OVER THE WATERPROOF BANDAGE WITH SOAP AND WATER EVERY DAY.     Constipation Prevention    Complete by:  As directed   Drink plenty of fluids.  Prune juice may be helpful.  You may use a stool softener, such as Colace (over the counter) 100 mg twice a day.  Use MiraLax (over the counter) for constipation as needed.     Diet - low sodium heart healthy    Complete by:  As directed      Discharge instructions    Complete by:  As directed   INSTRUCTIONS AFTER JOINT REPLACEMENT   Remove items at home which could result in a fall. This includes throw rugs or furniture in walking pathways ICE to the affected joint every three hours while awake for 30 minutes at a time, for at least the first 3-5 days, and then as needed for pain and swelling.  Continue to use ice for pain and swelling. You may notice swelling that will progress down to the foot and ankle.  This is normal after surgery.  Elevate your leg when you are not up walking on it.  Continue to use the breathing machine you got in the hospital (incentive spirometer) which will help keep your  temperature down.  It is common for your temperature to cycle up and down following surgery, especially at night when you are not up moving around and exerting yourself.  The breathing machine keeps your lungs expanded and your temperature down.   DIET:  As you were doing prior to hospitalization, we recommend a well-balanced diet.  DRESSING / WOUND CARE / SHOWERING  Keep the surgical dressing until follow up.  The dressing is water proof, so you can shower without any extra covering.  IF THE DRESSING FALLS OFF or the wound gets wet inside, change the dressing with sterile gauze.  Please use good hand washing techniques before changing the dressing.  Do not use any lotions or creams on the incision until instructed by your surgeon.    ACTIVITY  Increase activity slowly as tolerated, but follow the weight bearing instructions below.   No driving for 6 weeks or until further direction given by your physician.  You cannot drive while taking narcotics.  No lifting or carrying greater than 10 lbs. until further directed by your surgeon. Avoid periods of inactivity such as sitting longer than an hour when not asleep. This helps prevent blood clots.  You may return to work once you are authorized by your doctor.     WEIGHT BEARING   Weight bearing as tolerated with assist device (walker, cane, etc) as directed, use it as long as suggested by your surgeon or therapist, typically at least 2-4 weeks.   EXERCISES  Results after joint replacement surgery are often greatly improved when you follow the exercise, range of motion and muscle strengthening exercises prescribed by your doctor. Safety measures are also important to protect the joint from further injury. Any time any of these exercises cause you to have increased pain or swelling, decrease what you are doing until you are comfortable again and then slowly increase them. If you have problems or questions, call your caregiver or physical  therapist for advice.   Rehabilitation is important following a joint replacement. After just a few days of immobilization, the muscles of the leg can become weakened and shrink (atrophy).  These exercises are designed to build up the tone and strength of the thigh and leg muscles and to improve motion. Often times heat used for twenty to thirty minutes before working out will loosen up your tissues and help with improving the range of motion but do not use heat for the first two weeks following surgery (sometimes heat can increase post-operative swelling).   These exercises can be done on a training (exercise) mat, on the floor, on a table or on a bed. Use whatever works the best and is most comfortable for you.    Use music or television while you are exercising so that the exercises are a pleasant break in your day. This will make your life better with the exercises acting as a break in your routine that you can look forward to.   Perform all exercises about fifteen times, three times per day or as directed.  You should exercise both the operative leg and the other leg as well.   Exercises include:   Quad Sets - Tighten up the muscle on the front of the thigh (Quad) and hold for 5-10 seconds.   Straight Leg Raises - With your knee straight (if you were given a brace, keep it on), lift  the leg to 60 degrees, hold for 3 seconds, and slowly lower the leg.  Perform this exercise against resistance later as your leg gets stronger.  Leg Slides: Lying on your back, slowly slide your foot toward your buttocks, bending your knee up off the floor (only go as far as is comfortable). Then slowly slide your foot back down until your leg is flat on the floor again.  Angel Wings: Lying on your back spread your legs to the side as far apart as you can without causing discomfort.  Hamstring Strength:  Lying on your back, push your heel against the floor with your leg straight by tightening up the muscles of your  buttocks.  Repeat, but this time bend your knee to a comfortable angle, and push your heel against the floor.  You may put a pillow under the heel to make it more comfortable if necessary.   A rehabilitation program following joint replacement surgery can speed recovery and prevent re-injury in the future due to weakened muscles. Contact your doctor or a physical therapist for more information on knee rehabilitation.    CONSTIPATION  Constipation is defined medically as fewer than three stools per week and severe constipation as less than one stool per week.  Even if you have a regular bowel pattern at home, your normal regimen is likely to be disrupted due to multiple reasons following surgery.  Combination of anesthesia, postoperative narcotics, change in appetite and fluid intake all can affect your bowels.   YOU MUST use at least one of the following options; they are listed in order of increasing strength to get the job done.  They are all available over the counter, and you may need to use some, POSSIBLY even all of these options:    Drink plenty of fluids (prune juice may be helpful) and high fiber foods Colace 100 mg by mouth twice a day  Senokot for constipation as directed and as needed Dulcolax (bisacodyl), take with full glass of water  Miralax (polyethylene glycol) once or twice a day as needed.  If you have tried all these things and are unable to have a bowel movement in the first 3-4 days after surgery call either your surgeon or your primary doctor.    If you experience loose stools or diarrhea, hold the medications until you stool forms back up.  If your symptoms do not get better within 1 week or if they get worse, check with your doctor.  If you experience "the worst abdominal pain ever" or develop nausea or vomiting, please contact the office immediately for further recommendations for treatment.   ITCHING:  If you experience itching with your medications, try taking only a  single pain pill, or even half a pain pill at a time.  You can also use Benadryl over the counter for itching or also to help with sleep.   TED HOSE STOCKINGS:  Use stockings on both legs until for at least 2 weeks or as directed by physician office. They may be removed at night for sleeping.  MEDICATIONS:  See your medication summary on the "After Visit Summary" that nursing will review with you.  You may have some home medications which will be placed on hold until you complete the course of blood thinner medication.  It is important for you to complete the blood thinner medication as prescribed.  PRECAUTIONS:  If you experience chest pain or shortness of breath - call 911 immediately for transfer to the hospital  emergency department.   If you develop a fever greater that 101 F, purulent drainage from wound, increased redness or drainage from wound, foul odor from the wound/dressing, or calf pain - CONTACT YOUR SURGEON.                                                   FOLLOW-UP APPOINTMENTS:  If you do not already have a post-op appointment, please call the office for an appointment to be seen by your surgeon.  Guidelines for how soon to be seen are listed in your "After Visit Summary", but are typically between 1-4 weeks after surgery.  OTHER INSTRUCTIONS:   Knee Replacement:  Do not place pillow under knee, focus on keeping the knee straight while resting. CPM instructions: 0-90 degrees, 2 hours in the morning, 2 hours in the afternoon, and 2 hours in the evening. Place foam block, curve side up under heel at all times except when in CPM or when walking.  DO NOT modify, tear, cut, or change the foam block in any way.  MAKE SURE YOU:  Understand these instructions.  Get help right away if you are not doing well or get worse.    Thank you for letting us be a part of your medical care team.  It is a privilege we respect greatly.  We hope these instructions will help you stay on track for a  fast and full recovery!     Do not put a pillow under the knee. Place it under the heel.    Complete by:  As directed   Place gray foam block, curve side up under heel at all times except when in CPM or when walking.  DO NOT modify, tear, cut, or change in any way the gray foam block.     Increase activity slowly as tolerated    Complete by:  As directed      TED hose    Complete by:  As directed   Use stockings (TED hose) for 2 weeks on both leg(s).  You may remove them at night for sleeping.           Follow-up Information    Follow up with Lorn Junes, MD On 07/04/2015.   Specialty:  Orthopedic Surgery   Why:  APPT TIME 3 PM   Contact information:   [place] [address] [phone]       Follow up with [facility].   Why:  Someone from [facility] will contact you concerning start date and time for therapy.   Contact information:   [address] [place] [address] [phone]        Signed: Linda Hedges 06/22/2015, 8:09 AM

## 2016-07-18 ENCOUNTER — Other Ambulatory Visit: Payer: Self-pay | Admitting: *Deleted

## 2016-07-18 ENCOUNTER — Encounter: Payer: Self-pay | Admitting: Sports Medicine

## 2016-07-18 ENCOUNTER — Ambulatory Visit: Payer: Self-pay

## 2016-07-18 ENCOUNTER — Encounter (INDEPENDENT_AMBULATORY_CARE_PROVIDER_SITE_OTHER): Payer: Self-pay

## 2016-07-18 ENCOUNTER — Ambulatory Visit (INDEPENDENT_AMBULATORY_CARE_PROVIDER_SITE_OTHER): Payer: 59 | Admitting: Sports Medicine

## 2016-07-18 VITALS — BP 122/55 | HR 72 | Ht 60.0 in | Wt 128.0 lb

## 2016-07-18 DIAGNOSIS — M1712 Unilateral primary osteoarthritis, left knee: Secondary | ICD-10-CM | POA: Diagnosis not present

## 2016-07-18 DIAGNOSIS — M7632 Iliotibial band syndrome, left leg: Secondary | ICD-10-CM

## 2016-07-18 DIAGNOSIS — M79662 Pain in left lower leg: Secondary | ICD-10-CM

## 2016-07-18 NOTE — Patient Instructions (Signed)
There is inflammation around your left knee causing IT band irritation and decreased flexion of your knee.  Do 5 sets of wall slides with left knee and hold for 5 seconds. Start with standing, wrap cord around ankle and pull cord upward. Then lay prone and do hamstring curl with a cord  Place ankle weight on left ankle laying on your back and gradually let the weight of your ankle cause flexion of knee.  Lateral leg lifts while laying on your side and then while flexing knee do additional lifts. Stretch leg back while on your side and do additional lateral leg lifts.

## 2016-07-18 NOTE — Assessment & Plan Note (Signed)
Now S/P TKR by 13 mos  However, still has reactive process that is limiting her flexion Use compression sleeve Restart aggressive flexion exercises  Recheck in 2 to 3 mos.

## 2016-07-18 NOTE — Assessment & Plan Note (Signed)
We will focus on strength program for GM/ TFL and GMax to see if we can lessen impingement of ITB

## 2016-07-18 NOTE — Progress Notes (Signed)
  [name] - 55 y.o. female MRN [id number]  Date of birth: Aug 01, 1961  SUBJECTIVE:  Including CC & ROS. Left knee stiffness No chief complaint on file. Emily Ballard is a 55yo female with PMH of total arthroplasty of left knee (05/2015) presenting today for persistent, left knee pain with flexion. Pt reports that she has limited flexion of her left knee over the past couple of months. Post surgery pt mentioned that she received physical therapy and had flexion of 105 degrees of the knee. She has continues to use a stationary bike for exercise but states she has pain with when she starts exercise. Pain is aggravated by squatting. She has used antiinflammatory medications without relief. She has associated left lateral thigh pain at site of IT band. She mentions she has tried to stretch but that has provided no relief. No trouble walking.   Pt is also concerned about her orthotics not fitting her new shoes.   ROS: No fever No erythema of left knee Mild edema of left knee   HISTORY: Past Medical, Surgical, Social, and Family History Reviewed & Updated per EMR.   Pertinent Historical Findings include: PMSHx - Metatarsalgia - uses custom orthotics we made 6 and 3 years ago PSHx - non contributory  FHx -  Non contributory Medications - reviewed  DATA REVIEWED: none  PHYSICAL EXAM:  VS: BP:(!) 122/55  HR:72bpm  TEMP: ( )  RESP:   HT:5' (152.4 cm)   WT:128 lb (58.1 kg)  BMI:25.1 PHYSICAL EXAM: Gen: NAD, alert, cooperative with exam, well-appearing Resp: non-labored, normal speech Neuro: no gross deficits.  Knee: Left no erythema or obvious bony abnormalities. Effusion present  No warmth, joint line tenderness, patellar tenderness, condyle tenderness. ROM full in extension and lower leg rotation. Flexion ROM limited to 85-90 degrees Good stability  Non painful patellar compression. Patellar glide without crepitus. Patellar and quadriceps tendons unremarkable. Hamstring and  quadriceps strength is normal.  Excellent hip abduction strength  Ultrasound of Left Knee  All components of total knee seem in place Moderate to large effusion in SPP Left ITB seems to impinge over lateral edge of component and has hypoechoic swelling at this location Quadriceps tendon normal Patellar tendon is thickened  Impression: reactive effusion of knee moderate to large 1 year post Total knee Iliotibial band impingement  Ultrasound and interpretation by Stefanie Libel, MD  ASSESSMENT & PLAN:   No problem-specific Assessment & Plan notes found for this encounter. Left knee stiffness and  effusion: Consistent with inflammatory response to device

## 2016-08-30 DIAGNOSIS — Z96652 Presence of left artificial knee joint: Secondary | ICD-10-CM | POA: Diagnosis not present

## 2016-09-19 ENCOUNTER — Ambulatory Visit: Payer: Self-pay | Admitting: Sports Medicine

## 2016-09-27 ENCOUNTER — Ambulatory Visit: Payer: Self-pay | Admitting: Sports Medicine

## 2016-10-29 DIAGNOSIS — L570 Actinic keratosis: Secondary | ICD-10-CM | POA: Diagnosis not present

## 2016-10-29 DIAGNOSIS — C44212 Basal cell carcinoma of skin of right ear and external auricular canal: Secondary | ICD-10-CM | POA: Diagnosis not present

## 2016-10-29 DIAGNOSIS — L821 Other seborrheic keratosis: Secondary | ICD-10-CM | POA: Diagnosis not present

## 2016-10-29 DIAGNOSIS — Z85820 Personal history of malignant melanoma of skin: Secondary | ICD-10-CM | POA: Diagnosis not present

## 2016-11-13 ENCOUNTER — Ambulatory Visit: Payer: Self-pay | Admitting: Sports Medicine

## 2016-11-13 DIAGNOSIS — C44212 Basal cell carcinoma of skin of right ear and external auricular canal: Secondary | ICD-10-CM | POA: Diagnosis not present

## 2016-11-29 ENCOUNTER — Encounter: Payer: Self-pay | Admitting: Sports Medicine

## 2016-11-29 ENCOUNTER — Ambulatory Visit (INDEPENDENT_AMBULATORY_CARE_PROVIDER_SITE_OTHER): Payer: 59 | Admitting: Sports Medicine

## 2016-11-29 DIAGNOSIS — M1712 Unilateral primary osteoarthritis, left knee: Secondary | ICD-10-CM | POA: Diagnosis not present

## 2016-11-29 NOTE — Assessment & Plan Note (Signed)
I think the TKR seems mechanically sound She is getting a reactive process to the components that creates effusions Functionally doing well with walking program  She needs to continue trying to improve flexion She has made progress Use compression after active day and try icing  I would like to follow in 3 mos to see if she can get closer to 120 deg of flexion

## 2016-11-29 NOTE — Progress Notes (Signed)
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 Full extension Stable to drawer testing No excess medial or lateral motion Flexion - improved but gets pain at 105 to 110 flexion  Ultrasound  reveals that she still gets reactive effusion near TK components Pocket under ITB is 50% smaller Effusion still is moderate size overall

## 2017-01-16 DIAGNOSIS — Z01419 Encounter for gynecological examination (general) (routine) without abnormal findings: Secondary | ICD-10-CM | POA: Diagnosis not present

## 2017-03-15 DIAGNOSIS — G8929 Other chronic pain: Secondary | ICD-10-CM | POA: Diagnosis not present

## 2017-03-15 DIAGNOSIS — M25562 Pain in left knee: Secondary | ICD-10-CM | POA: Diagnosis not present

## 2017-03-21 ENCOUNTER — Ambulatory Visit: Payer: 59 | Admitting: Sports Medicine

## 2017-05-02 DIAGNOSIS — M1712 Unilateral primary osteoarthritis, left knee: Secondary | ICD-10-CM | POA: Diagnosis not present

## 2017-05-07 ENCOUNTER — Other Ambulatory Visit (HOSPITAL_COMMUNITY): Payer: Self-pay | Admitting: Orthopedic Surgery

## 2017-05-07 DIAGNOSIS — M25562 Pain in left knee: Secondary | ICD-10-CM

## 2017-05-07 DIAGNOSIS — Z1159 Encounter for screening for other viral diseases: Secondary | ICD-10-CM | POA: Diagnosis not present

## 2017-05-07 DIAGNOSIS — Z131 Encounter for screening for diabetes mellitus: Secondary | ICD-10-CM | POA: Diagnosis not present

## 2017-05-13 ENCOUNTER — Encounter (HOSPITAL_COMMUNITY)
Admission: RE | Admit: 2017-05-13 | Discharge: 2017-05-13 | Disposition: A | Discharge status: Home / Self Care | Payer: 59 | Source: Ambulatory Visit | Attending: Orthopedic Surgery | Admitting: Orthopedic Surgery

## 2017-05-13 ENCOUNTER — Encounter (HOSPITAL_COMMUNITY): Payer: Self-pay

## 2017-05-13 DIAGNOSIS — M25562 Pain in left knee: Secondary | ICD-10-CM | POA: Diagnosis not present

## 2017-05-13 DIAGNOSIS — M79672 Pain in left foot: Secondary | ICD-10-CM | POA: Diagnosis not present

## 2017-05-13 MED ORDER — TECHNETIUM TC 99M MEDRONATE IV KIT
21.8000 | PACK | Freq: Once | INTRAVENOUS | Status: AC | PRN
  Administered 2017-05-13: 21.8 via INTRAVENOUS

## 2017-05-22 DIAGNOSIS — Z23 Encounter for immunization: Secondary | ICD-10-CM | POA: Diagnosis not present

## 2017-05-29 DIAGNOSIS — G479 Sleep disorder, unspecified: Secondary | ICD-10-CM | POA: Diagnosis not present

## 2017-05-29 DIAGNOSIS — J309 Allergic rhinitis, unspecified: Secondary | ICD-10-CM | POA: Diagnosis not present

## 2017-05-29 DIAGNOSIS — Z86010 Personal history of colonic polyps: Secondary | ICD-10-CM | POA: Diagnosis not present

## 2017-06-23 ENCOUNTER — Ambulatory Visit: Payer: Self-pay | Admitting: Orthopedic Surgery

## 2017-07-03 NOTE — Patient Instructions (Addendum)
Emily Ballard  07/03/2017   Your procedure is scheduled on:   Wednesday, Nov. 21, 2018   Report to [facility] Main  Entrance    Report to admitting at 12:00 PM   Call this number if you have problems the morning of surgery [phone]   Remember: ONLY 1 PERSON MAY GO WITH YOU TO SHORT STAY TO GET  READY MORNING OF [place].   Do not eat food :After Midnight.   May have clear liquids until 8:30 AM Wednesday Morning    CLEAR LIQUID DIET   Foods Allowed                                                                     Foods Excluded  Coffee and tea, regular and decaf                             liquids that you cannot  Plain Jell-O in any flavor                                             see through such as: Fruit ices (not with fruit pulp)                                     milk, soups, orange juice  Iced Popsicles                                    All solid food Carbonated beverages, regular and diet                                    Cranberry, grape and apple juices Sports drinks like Gatorade Lightly seasoned clear broth or consume(fat free) Sugar, honey syrup  Sample Menu Breakfast                                Lunch                                     Supper Cranberry juice                    Beef broth                            Chicken broth Jell-O                                     Grape juice  Apple juice Coffee or tea                        Jell-O                                      Popsicle                                                Coffee or tea                        Coffee or tea  _____________________________________________________________________     Take these medicines the morning of surgery with A SIP OF WATER: Claritin, Alprazolam if needed   Use nasal spray and eye drops per normal routine                               You may not have any metal on your body including hair pins,  jewelry, and piercings              Do not wear make-up, lotions, powders or perfumes, deodorant             Do not wear nail polish.  Do not shave  48 hours prior to surgery.              Do not bring valuables to the hospital. [place].   Contacts, dentures or bridgework may not be worn into surgery.   Leave suitcase in the car. After surgery it may be brought to your room.              Please read over the following fact sheets you were given: _____________________________________________________________________             [facility] - Preparing for Surgery Before surgery, you can play an important role.  Because skin is not sterile, your skin needs to be as free of germs as possible.  You can reduce the number of germs on your skin by washing with CHG (chlorahexidine gluconate) soap before surgery.  CHG is an antiseptic cleaner which kills germs and bonds with the skin to continue killing germs even after washing. Please DO NOT use if you have an allergy to CHG or antibacterial soaps.  If your skin becomes reddened/irritated stop using the CHG and inform your nurse when you arrive at Short Stay. Do not shave (including legs and underarms) for at least 48 hours prior to the first CHG shower.  You may shave your face/neck.  Please follow these instructions carefully:  1.  Shower with CHG Soap the night before surgery and the  morning of surgery.  2.  If you choose to wash your hair, wash your hair first as usual with your normal  shampoo.  3.  After you shampoo, rinse your hair and body thoroughly to remove the shampoo.                             4.  Use CHG as you  would any other liquid soap.  You can apply chg directly to the skin and wash.  Gently with a scrungie or clean washcloth.  5.  Apply the CHG Soap to your body ONLY FROM THE NECK DOWN.   Do   not use on face/ open                           Wound or open sores. Avoid contact  with eyes, ears mouth and   genitals (private parts).                       Wash face,  Genitals (private parts) with your normal soap.             6.  Wash thoroughly, paying special attention to the area where your    surgery  will be performed.  7.  Thoroughly rinse your body with warm water from the neck down.  8.  DO NOT shower/wash with your normal soap after using and rinsing off the CHG Soap.                9.  Pat yourself dry with a clean towel.            10.  Wear clean pajamas.            11.  Place clean sheets on your bed the night of your first shower and do not  sleep with pets. Day of Surgery : Do not apply any lotions/deodorants the morning of surgery.  Please wear clean clothes to the hospital/surgery center.  FAILURE TO FOLLOW THESE INSTRUCTIONS MAY RESULT IN THE CANCELLATION OF YOUR SURGERY  PATIENT SIGNATURE_________________________________  NURSE SIGNATURE__________________________________  ________________________________________________________________________   Emily Ballard  An incentive spirometer is a tool that can help keep your lungs clear and active. This tool measures how well you are filling your lungs with each breath. Taking long deep breaths may help reverse or decrease the chance of developing breathing (pulmonary) problems (especially infection) following:  A long period of time when you are unable to move or be active. BEFORE THE PROCEDURE   If the spirometer includes an indicator to show your best effort, your nurse or respiratory therapist will set it to a desired goal.  If possible, sit up straight or lean slightly forward. Try not to slouch.  Hold the incentive spirometer in an upright position. INSTRUCTIONS FOR USE  1. Sit on the edge of your bed if possible, or sit up as far as you can in bed or on a chair. 2. Hold the incentive spirometer in an upright position. 3. Breathe out normally. 4. Place the mouthpiece in your mouth  and seal your lips tightly around it. 5. Breathe in slowly and as deeply as possible, raising the piston or the ball toward the top of the column. 6. Hold your breath for 3-5 seconds or for as long as possible. Allow the piston or ball to fall to the bottom of the column. 7. Remove the mouthpiece from your mouth and breathe out normally. 8. Rest for a few seconds and repeat Steps 1 through 7 at least 10 times every 1-2 hours when you are awake. Take your time and take a few normal breaths between deep breaths. 9. The spirometer may include an indicator to show your best effort. Use the indicator as a goal to work toward during each repetition. 10. After each set of  10 deep breaths, practice coughing to be sure your lungs are clear. If you have an incision (the cut made at the time of surgery), support your incision when coughing by placing a pillow or rolled up towels firmly against it. Once you are able to get out of bed, walk around indoors and cough well. You may stop using the incentive spirometer when instructed by your caregiver.  RISKS AND COMPLICATIONS  Take your time so you do not get dizzy or light-headed.  If you are in pain, you may need to take or ask for pain medication before doing incentive spirometry. It is harder to take a deep breath if you are having pain. AFTER USE  Rest and breathe slowly and easily.  It can be helpful to keep track of a log of your progress. Your caregiver can provide you with a simple table to help with this. If you are using the spirometer at home, follow these instructions: [place] IF:   You are having difficultly using the spirometer.  You have trouble using the spirometer as often as instructed.  Your pain medication is not giving enough relief while using the spirometer.  You develop fever of 100.5 F (38.1 C) or higher. SEEK IMMEDIATE MEDICAL CARE IF:   You cough up bloody sputum that had not been present before.  You develop  fever of 102 F (38.9 C) or greater.  You develop worsening pain at or near the incision site. MAKE SURE YOU:   Understand these instructions.  Will watch your condition.  Will get help right away if you are not doing well or get worse. Document Released: 12/24/2006 Document Revised: 11/05/2011 Document Reviewed: 02/24/2007 ExitCare Patient Information 2014 ExitCare, [place].   ________________________________________________________________________  WHAT IS A BLOOD TRANSFUSION? Blood Transfusion Information  A transfusion is the replacement of blood or some of its parts. Blood is made up of multiple cells which provide different functions.  Red blood cells carry oxygen and are used for blood loss replacement.  White blood cells fight against infection.  Platelets control bleeding.  Plasma helps clot blood.  Other blood products are available for specialized needs, such as hemophilia or other clotting disorders. BEFORE THE TRANSFUSION  Who gives blood for transfusions?   Healthy volunteers who are fully evaluated to make sure their blood is safe. This is blood bank blood. Transfusion therapy is the safest it has ever been in the practice of medicine. Before blood is taken from a donor, a complete history is taken to make sure that person has no history of diseases nor engages in risky social behavior (examples are intravenous drug use or sexual activity with multiple partners). The donor's travel history is screened to minimize risk of transmitting infections, such as malaria. The donated blood is tested for signs of infectious diseases, such as HIV and hepatitis. The blood is then tested to be sure it is compatible with you in order to minimize the chance of a transfusion reaction. If you or a relative donates blood, this is often done in anticipation of surgery and is not appropriate for emergency situations. It takes many days to process the donated blood. RISKS AND  COMPLICATIONS Although transfusion therapy is very safe and saves many lives, the main dangers of transfusion include:   Getting an infectious disease.  Developing a transfusion reaction. This is an allergic reaction to something in the blood you were given. Every precaution is taken to prevent this. The decision to have a blood  transfusion has been considered carefully by your caregiver before blood is given. Blood is not given unless the benefits outweigh the risks. AFTER THE TRANSFUSION  Right after receiving a blood transfusion, you will usually feel much better and more energetic. This is especially true if your red blood cells have gotten low (anemic). The transfusion raises the level of the red blood cells which carry oxygen, and this usually causes an energy increase.  The nurse administering the transfusion will monitor you carefully for complications. HOME CARE INSTRUCTIONS  No special instructions are needed after a transfusion. You may find your energy is better. Speak with your caregiver about any limitations on activity for underlying diseases you may have. SEEK MEDICAL CARE IF:   Your condition is not improving after your transfusion.  You develop redness or irritation at the intravenous (IV) site. SEEK IMMEDIATE MEDICAL CARE IF:  Any of the following symptoms occur over the next 12 hours:  Shaking chills.  You have a temperature by mouth above 102 F (38.9 C), not controlled by medicine.  Chest, back, or muscle pain.  People around you feel you are not acting correctly or are confused.  Shortness of breath or difficulty breathing.  Dizziness and fainting.  You get a rash or develop hives.  You have a decrease in urine output.  Your urine turns a dark color or changes to pink, red, or brown. Any of the following symptoms occur over the next 10 days:  You have a temperature by mouth above 102 F (38.9 C), not controlled by medicine.  Shortness of  breath.  Weakness after normal activity.  The white part of the eye turns yellow (jaundice).  You have a decrease in the amount of urine or are urinating less often.  Your urine turns a dark color or changes to pink, red, or brown. Document Released: 08/10/2000 Document Revised: 11/05/2011 Document Reviewed: 03/29/2008 [facility] Patient Information 2014 [place], [place].  _______________________________________________________________________

## 2017-07-03 NOTE — Pre-Procedure Instructions (Signed)
Surgical clearance 05/29/17 in chart The following are in epic: Last office visit note from Dr. Oneida Alar 11/29/16 Bone scan 05/14/17

## 2017-07-08 ENCOUNTER — Encounter (HOSPITAL_COMMUNITY)
Admission: RE | Admit: 2017-07-08 | Discharge: 2017-07-08 | Disposition: A | Discharge status: Home / Self Care | Payer: 59 | Source: Ambulatory Visit | Attending: Orthopedic Surgery | Admitting: Orthopedic Surgery

## 2017-07-08 ENCOUNTER — Other Ambulatory Visit: Payer: Self-pay

## 2017-07-08 ENCOUNTER — Encounter (HOSPITAL_COMMUNITY): Payer: Self-pay

## 2017-07-08 ENCOUNTER — Encounter (INDEPENDENT_AMBULATORY_CARE_PROVIDER_SITE_OTHER): Payer: Self-pay

## 2017-07-08 DIAGNOSIS — M25562 Pain in left knee: Secondary | ICD-10-CM | POA: Diagnosis not present

## 2017-07-08 DIAGNOSIS — Z01812 Encounter for preprocedural laboratory examination: Secondary | ICD-10-CM | POA: Diagnosis present

## 2017-07-08 DIAGNOSIS — I517 Cardiomegaly: Secondary | ICD-10-CM | POA: Diagnosis not present

## 2017-07-08 DIAGNOSIS — M1712 Unilateral primary osteoarthritis, left knee: Secondary | ICD-10-CM | POA: Diagnosis not present

## 2017-07-08 DIAGNOSIS — R05 Cough: Secondary | ICD-10-CM | POA: Diagnosis not present

## 2017-07-08 DIAGNOSIS — S83207A Unspecified tear of unspecified meniscus, current injury, left knee, initial encounter: Secondary | ICD-10-CM | POA: Diagnosis not present

## 2017-07-08 DIAGNOSIS — Z01810 Encounter for preprocedural cardiovascular examination: Secondary | ICD-10-CM | POA: Diagnosis present

## 2017-07-08 DIAGNOSIS — R269 Unspecified abnormalities of gait and mobility: Secondary | ICD-10-CM | POA: Insufficient documentation

## 2017-07-08 DIAGNOSIS — K219 Gastro-esophageal reflux disease without esophagitis: Secondary | ICD-10-CM | POA: Diagnosis not present

## 2017-07-08 HISTORY — DX: Malignant melanoma of skin, unspecified: C43.9

## 2017-07-08 LAB — COMPREHENSIVE METABOLIC PANEL
ALT: 20 U/L (ref 14–54)
AST: 29 U/L (ref 15–41)
Albumin: 4 g/dL (ref 3.5–5.0)
Alkaline Phosphatase: 42 U/L (ref 38–126)
Anion gap: 5 (ref 5–15)
BUN: 15 mg/dL (ref 6–20)
CHLORIDE: 105 mmol/L (ref 101–111)
CO2: 27 mmol/L (ref 22–32)
Calcium: 9.1 mg/dL (ref 8.9–10.3)
Creatinine, Ser: 0.98 mg/dL (ref 0.44–1.00)
Glucose, Bld: 108 mg/dL — ABNORMAL HIGH (ref 65–99)
POTASSIUM: 4.5 mmol/L (ref 3.5–5.1)
SODIUM: 137 mmol/L (ref 135–145)
Total Bilirubin: 0.5 mg/dL (ref 0.3–1.2)
Total Protein: 6.9 g/dL (ref 6.5–8.1)

## 2017-07-08 LAB — SURGICAL PCR SCREEN
MRSA, PCR: NEGATIVE
Staphylococcus aureus: NEGATIVE

## 2017-07-08 LAB — APTT: aPTT: 28 seconds (ref 24–36)

## 2017-07-08 LAB — CBC
HCT: 41.1 % (ref 36.0–46.0)
Hemoglobin: 14 g/dL (ref 12.0–15.0)
MCH: 30.7 pg (ref 26.0–34.0)
MCHC: 34.1 g/dL (ref 30.0–36.0)
MCV: 90.1 fL (ref 78.0–100.0)
PLATELETS: 243 10*3/uL (ref 150–400)
RBC: 4.56 MIL/uL (ref 3.87–5.11)
RDW: 12 % (ref 11.5–15.5)
WBC: 5.2 10*3/uL (ref 4.0–10.5)

## 2017-07-08 LAB — ABO/RH: ABO/RH(D): O POS

## 2017-07-08 LAB — PROTIME-INR
INR: 0.93
PROTHROMBIN TIME: 12.4 s (ref 11.4–15.2)

## 2017-07-10 NOTE — Pre-Procedure Instructions (Signed)
Spoke with Maritta about time change new arrival time 10:30AM clear liquids until 7:00 AM day of surgery.

## 2017-07-13 ENCOUNTER — Ambulatory Visit: Payer: Self-pay | Admitting: Orthopedic Surgery

## 2017-07-13 NOTE — H&P (Signed)
Emily Ballard DOB: [birth year] Married / Language: English / Race: White Female Date of admission: July 17, 2017  Chief complaint: Unstable left total knee History of Present Illness  The patient is a 56 year old female who comes in  for a preoperative History and Physical. The patient is scheduled for a left knee polyethylene versus total knee revision to be performed by Dr. Dione Plover. Aluisio, MD at [facility] on 07-17-2017. The patient is a 56 year old female who presented for follow up of their knee. The patient is being followed for their left knee pain. They are now weeks out from aspiration and labs. Symptoms reported include: pain. The patient feels that they are doing 0 percent better. Patient states that wearing the knee brace helps on the medial and lateral side. She has been taking Aleve as needed. She said that the brace does help some but is not eliminating all the problem. She still has the swelling. We did the aspiration and the fluid came back negative for infection. She has an unstable total knee arthroplasty. There were no signs of infection on the fluid analysis. At this point the most predictable way of getting her better would be a revision. We discussed the possibilities of just a polyethylene revision versus having to do a total knee arthroplasty revision. We will be prepared for both. We discussed all this in detail and she would like to go ahead and proceed with surgical treatment. She understands and would like to go ahead and proceed with surgery. They have been treated conservatively in the past for the above stated problem and despite conservative measures, they continue to have progressive pain and severe functional limitations and dysfunction. They have failed non-operative management including home exercise, medications, and bracing. It is felt that they would benefit from undergoing revision of the total joint replacement. Risks and benefits of the  procedure have been discussed with the patient and they elect to proceed with surgery. There are no active contraindications to surgery such as ongoing infection or rapidly progressive neurological disease.   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 Latex Gloves *MEDICAL DEVICES AND SUPPLIES*  Shellfish   Family History Cancer  father, grandfather mothers side and grandfather fathers side Congestive Heart Failure  mother Depression  mother Diabetes Mellitus  grandmother mothers side Drug / Alcohol Addiction  mother Heart Disease  mother Heart disease in female family member before age 57  Osteoarthritis  father Osteoporosis  mother and sister  Social History Alcohol use  current drinker; drinks wine; less than 5 per week Children  2 Current work status  working full time Drug/Alcohol Rehab (Currently)  no Exercise  Exercises daily; does running / walking and other Illicit drug use  no Living situation  live with spouse Marital status  married Most recent primary occupation  [employer] Compliance & Ethics - [employer] Number of flights of stairs before winded  less than 1 Pain Contract  no Previously in rehab  no Tobacco / smoke exposure  no Tobacco use  former smoker; smoke(d) 3 or more pack(s) per day [place] With Family, Home, Straight to Outpatient Therapy.  Medication History  Claritin-D 12 Hour (Oral) Specific strength unknown - Active. Vitamin D (Oral) Specific strength unknown - Active. Melatonin (Oral)  Specific strength unknown - Active. TraZODone HCl (50MG  Tablet, Oral) Active. Retin-A (External) Specific strength unknown - Active. Probiotic Acidophilus (Oral) Active. Premarin (0.9MG  Tablet, Oral)  Active. Cephalexin (500MG  Capsule, Oral) Active. Restasis (0.05% Emulsion, Ophthalmic) Active. MetroNIDAZOLE (0.75% Cream, External) Active.  Past Surgical History  Carpal Tunnel Repair  right Cesarean Delivery  2 times Hysterectomy  partial (non-cancerous) Tonsillectomy   Review of Systems General Not Present- Chills, Fatigue, Fever, Memory Loss, Night Sweats, Weight Gain and Weight Loss. Skin Not Present- Eczema, Hives, Itching, Lesions and Rash. HEENT Not Present- Dentures, Double Vision, Headache, Hearing Loss, Tinnitus and Visual Loss. Respiratory Not Present- Allergies, Chronic Cough, Coughing up blood, Shortness of breath at rest and Shortness of breath with exertion. Cardiovascular Not Present- Chest Pain, Difficulty Breathing Lying Down, Murmur, Palpitations, Racing/skipping heartbeats and Swelling. Gastrointestinal Not Present- Abdominal Pain, Bloody Stool, Constipation, Diarrhea, Difficulty Swallowing, Heartburn, Jaundice, Loss of appetitie, Nausea and Vomiting. Female Genitourinary Not Present- Blood in Urine, Discharge, Flank Pain, Incontinence, Painful Urination, Urgency, Urinary frequency, Urinary Retention, Urinating at Night and Weak urinary stream. Musculoskeletal Present- Joint Pain. Not Present- Back Pain, Joint Swelling, Morning Stiffness, Muscle Pain, Muscle Weakness and Spasms. Neurological Not Present- Blackout spells, Difficulty with balance, Dizziness, Paralysis, Tremor and Weakness. Psychiatric Not Present- Insomnia.  Vitals  Weight: 137 lb Height: 60in Weight was reported by patient. Height was reported by patient. Body Surface Area: 1.59 m Body Mass Index: 26.76 kg/m  Pulse: 76 (Regular)  BP: 118/76 (Sitting, Right Arm, Standard)   Physical Exam  General Mental Status -Alert, cooperative and good historian. General Appearance-pleasant, Not in acute distress. Orientation-Oriented X3. Build & Nutrition-Well nourished and Well  developed.  Head and Neck Head-normocephalic, atraumatic . Neck Global Assessment - supple, no bruit auscultated on the right, no bruit auscultated on the left.  Eye Vision-Wears contact lenses(left eye). Pupil - Bilateral-Regular and Round. Motion - Bilateral-EOMI.  Chest and Lung Exam Auscultation Breath sounds - clear at anterior chest wall and clear at posterior chest wall. Adventitious sounds - No Adventitious sounds.  Cardiovascular Auscultation Rhythm - Regular rate and rhythm. Heart Sounds - S1 WNL and S2 WNL. Murmurs & Other Heart Sounds - Auscultation of the heart reveals - No Murmurs.  Abdomen Palpation/Percussion Tenderness - Abdomen is non-tender to palpation. Rigidity (guarding) - Abdomen is soft. Auscultation Auscultation of the abdomen reveals - Bowel sounds normal.  Female Genitourinary Note: Not done, not pertinent to present illness   Musculoskeletal Note: Her LEFT knee shows trace effusion. Range 0-125. She has varus valgus laxity in extension and AP laxity in flexion.   Assessment & Plan  Status post total left knee replacement (O27.741)  Note:Surgical Plans: Left Total Knee Revision  Disposition: Home with family  PCP: Dr. Theadore Nan - Patient has been seen preoperatively and felt to be stable for surgery.  IV TXA  Anesthesia Issues: None  Patient was instructed on what medications to stop prior to surgery.  Signed electronically by Joelene Millin, III PA-C

## 2017-07-13 NOTE — H&P (View-Only) (Signed)
Emily Ballard DOB: 30-Oct-1960 Married / Language: English / Race: White Female Date of admission: July 17, 2017  Chief complaint: Unstable left total knee History of Present Illness  The patient is a 56 year old female who comes in  for a preoperative History and Physical. The patient is scheduled for a left knee polyethylene versus total knee revision to be performed by Dr. Dione Plover. Aluisio, MD at [facility] on 07-17-2017. The patient is a 56 year old female who presented for follow up of their knee. The patient is being followed for their left knee pain. They are now weeks out from aspiration and labs. Symptoms reported include: pain. The patient feels that they are doing 0 percent better. Patient states that wearing the knee brace helps on the medial and lateral side. She has been taking Aleve as needed. She said that the brace does help some but is not eliminating all the problem. She still has the swelling. We did the aspiration and the fluid came back negative for infection. She has an unstable total knee arthroplasty. There were no signs of infection on the fluid analysis. At this point the most predictable way of getting her better would be a revision. We discussed the possibilities of just a polyethylene revision versus having to do a total knee arthroplasty revision. We will be prepared for both. We discussed all this in detail and she would like to go ahead and proceed with surgical treatment. She understands and would like to go ahead and proceed with surgery. They have been treated conservatively in the past for the above stated problem and despite conservative measures, they continue to have progressive pain and severe functional limitations and dysfunction. They have failed non-operative management including home exercise, medications, and bracing. It is felt that they would benefit from undergoing revision of the total joint replacement. Risks and benefits of the  procedure have been discussed with the patient and they elect to proceed with surgery. There are no active contraindications to surgery such as ongoing infection or rapidly progressive neurological disease.   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 Latex Gloves *MEDICAL DEVICES AND SUPPLIES*  Shellfish   Family History Cancer  father, grandfather mothers side and grandfather fathers side Congestive Heart Failure  mother Depression  mother Diabetes Mellitus  grandmother mothers side Drug / Alcohol Addiction  mother Heart Disease  mother Heart disease in female family member before age 47  Osteoarthritis  father Osteoporosis  mother and sister  Social History Alcohol use  current drinker; drinks wine; less than 5 per week Children  2 Current work status  working full time Drug/Alcohol Rehab (Currently)  no Exercise  Exercises daily; does running / walking and other Illicit drug use  no Living situation  live with spouse Marital status  married Most recent primary occupation  M[employer] Compliance & Ethics - [employer] Number of flights of stairs before winded  less than 1 Pain Contract  no Previously in rehab  no Tobacco / smoke exposure  no Tobacco use  former smoker; smoke(d) 3 or more pack(s) per day [place] With Family, Home, Straight to Outpatient Therapy.  Medication History  Claritin-D 12 Hour (Oral) Specific strength unknown - Active. Vitamin D (Oral) Specific strength unknown - Active. Melatonin (Oral)  Specific strength unknown - Active. TraZODone HCl (50MG  Tablet, Oral) Active. Retin-A (External) Specific strength unknown - Active. Probiotic Acidophilus (Oral) Active. Premarin (0.9MG  Tablet, Oral)  Active. Cephalexin (500MG  Capsule, Oral) Active. Restasis (0.05% Emulsion, Ophthalmic) Active. MetroNIDAZOLE (0.75% Cream, External) Active.  Past Surgical History  Carpal Tunnel Repair  right Cesarean Delivery  2 times Hysterectomy  partial (non-cancerous) Tonsillectomy   Review of Systems General Not Present- Chills, Fatigue, Fever, Memory Loss, Night Sweats, Weight Gain and Weight Loss. Skin Not Present- Eczema, Hives, Itching, Lesions and Rash. HEENT Not Present- Dentures, Double Vision, Headache, Hearing Loss, Tinnitus and Visual Loss. Respiratory Not Present- Allergies, Chronic Cough, Coughing up blood, Shortness of breath at rest and Shortness of breath with exertion. Cardiovascular Not Present- Chest Pain, Difficulty Breathing Lying Down, Murmur, Palpitations, Racing/skipping heartbeats and Swelling. Gastrointestinal Not Present- Abdominal Pain, Bloody Stool, Constipation, Diarrhea, Difficulty Swallowing, Heartburn, Jaundice, Loss of appetitie, Nausea and Vomiting. Female Genitourinary Not Present- Blood in Urine, Discharge, Flank Pain, Incontinence, Painful Urination, Urgency, Urinary frequency, Urinary Retention, Urinating at Night and Weak urinary stream. Musculoskeletal Present- Joint Pain. Not Present- Back Pain, Joint Swelling, Morning Stiffness, Muscle Pain, Muscle Weakness and Spasms. Neurological Not Present- Blackout spells, Difficulty with balance, Dizziness, Paralysis, Tremor and Weakness. Psychiatric Not Present- Insomnia.  Vitals  Weight: 137 lb Height: 60in Weight was reported by patient. Height was reported by patient. Body Surface Area: 1.59 m Body Mass Index: 26.76 kg/m  Pulse: 76 (Regular)  BP: 118/76 (Sitting, Right Arm, Standard)   Physical Exam  General Mental Status -Alert, cooperative and good historian. General Appearance-pleasant, Not in acute distress. Orientation-Oriented X3. Build & Nutrition-Well nourished and Well  developed.  Head and Neck Head-normocephalic, atraumatic . Neck Global Assessment - supple, no bruit auscultated on the right, no bruit auscultated on the left.  Eye Vision-Wears contact lenses(left eye). Pupil - Bilateral-Regular and Round. Motion - Bilateral-EOMI.  Chest and Lung Exam Auscultation Breath sounds - clear at anterior chest wall and clear at posterior chest wall. Adventitious sounds - No Adventitious sounds.  Cardiovascular Auscultation Rhythm - Regular rate and rhythm. Heart Sounds - S1 WNL and S2 WNL. Murmurs & Other Heart Sounds - Auscultation of the heart reveals - No Murmurs.  Abdomen Palpation/Percussion Tenderness - Abdomen is non-tender to palpation. Rigidity (guarding) - Abdomen is soft. Auscultation Auscultation of the abdomen reveals - Bowel sounds normal.  Female Genitourinary Note: Not done, not pertinent to present illness   Musculoskeletal Note: Her LEFT knee shows trace effusion. Range 0-125. She has varus valgus laxity in extension and AP laxity in flexion.   Assessment & Plan  Status post total left knee replacement (U98.119)  Note:Surgical Plans: Left Total Knee Revision  Disposition: Home with family  PCP: Dr. Theadore Nan - Patient has been seen preoperatively and felt to be stable for surgery.  IV TXA  Anesthesia Issues: None  Patient was instructed on what medications to stop prior to surgery.  Signed electronically by Joelene Millin, III PA-C

## 2017-07-17 ENCOUNTER — Encounter (HOSPITAL_COMMUNITY): Payer: Self-pay | Admitting: Anesthesiology

## 2017-07-17 ENCOUNTER — Encounter (HOSPITAL_COMMUNITY)
Admission: RE | Disposition: A | Discharge status: Home Health | Payer: Self-pay | Source: Ambulatory Visit | Attending: Orthopedic Surgery

## 2017-07-17 ENCOUNTER — Inpatient Hospital Stay (HOSPITAL_COMMUNITY): Payer: 59 | Admitting: Anesthesiology

## 2017-07-17 ENCOUNTER — Other Ambulatory Visit: Payer: Self-pay

## 2017-07-17 ENCOUNTER — Inpatient Hospital Stay (HOSPITAL_COMMUNITY)
Admission: RE | Admit: 2017-07-17 | Discharge: 2017-07-18 | DRG: 468 | Disposition: A | Discharge status: Home Health | Payer: 59 | Source: Ambulatory Visit | Attending: Orthopedic Surgery | Admitting: Orthopedic Surgery

## 2017-07-17 DIAGNOSIS — K219 Gastro-esophageal reflux disease without esophagitis: Secondary | ICD-10-CM | POA: Diagnosis present

## 2017-07-17 DIAGNOSIS — T84018A Broken internal joint prosthesis, other site, initial encounter: Secondary | ICD-10-CM

## 2017-07-17 DIAGNOSIS — Z96652 Presence of left artificial knee joint: Secondary | ICD-10-CM | POA: Diagnosis not present

## 2017-07-17 DIAGNOSIS — Z87891 Personal history of nicotine dependence: Secondary | ICD-10-CM | POA: Diagnosis not present

## 2017-07-17 DIAGNOSIS — Z90710 Acquired absence of both cervix and uterus: Secondary | ICD-10-CM | POA: Diagnosis not present

## 2017-07-17 DIAGNOSIS — G8929 Other chronic pain: Secondary | ICD-10-CM | POA: Diagnosis present

## 2017-07-17 DIAGNOSIS — G8918 Other acute postprocedural pain: Secondary | ICD-10-CM | POA: Diagnosis not present

## 2017-07-17 DIAGNOSIS — Z882 Allergy status to sulfonamides status: Secondary | ICD-10-CM

## 2017-07-17 DIAGNOSIS — Z91040 Latex allergy status: Secondary | ICD-10-CM | POA: Diagnosis not present

## 2017-07-17 DIAGNOSIS — Y792 Prosthetic and other implants, materials and accessory orthopedic devices associated with adverse incidents: Secondary | ICD-10-CM | POA: Diagnosis present

## 2017-07-17 DIAGNOSIS — Z85820 Personal history of malignant melanoma of skin: Secondary | ICD-10-CM

## 2017-07-17 DIAGNOSIS — Z91013 Allergy to seafood: Secondary | ICD-10-CM

## 2017-07-17 DIAGNOSIS — T84023A Instability of internal left knee prosthesis, initial encounter: Principal | ICD-10-CM | POA: Diagnosis present

## 2017-07-17 DIAGNOSIS — M810 Age-related osteoporosis without current pathological fracture: Secondary | ICD-10-CM | POA: Diagnosis present

## 2017-07-17 DIAGNOSIS — T84093A Other mechanical complication of internal left knee prosthesis, initial encounter: Secondary | ICD-10-CM | POA: Diagnosis not present

## 2017-07-17 DIAGNOSIS — M25562 Pain in left knee: Secondary | ICD-10-CM | POA: Diagnosis not present

## 2017-07-17 DIAGNOSIS — Z96659 Presence of unspecified artificial knee joint: Secondary | ICD-10-CM

## 2017-07-17 HISTORY — PX: TOTAL KNEE ARTHROPLASTY WITH REVISION COMPONENTS: SHX6198

## 2017-07-17 LAB — TYPE AND SCREEN
ABO/RH(D): O POS
ANTIBODY SCREEN: NEGATIVE

## 2017-07-17 SURGERY — TOTAL KNEE ARTHROPLASTY WITH REVISION COMPONENTS
Anesthesia: Regional | Site: Knee | Laterality: Left

## 2017-07-17 MED ORDER — POLYETHYLENE GLYCOL 3350 17 G PO PACK: 17.0000 g | PACK | Freq: Every day | ORAL | Status: DC | PRN

## 2017-07-17 MED ORDER — MENTHOL 3 MG MT LOZG: 1.0000 | LOZENGE | OROMUCOSAL | Status: DC | PRN

## 2017-07-17 MED ORDER — SODIUM CHLORIDE 0.9 % IJ SOLN
INTRAMUSCULAR | Status: AC
Start: 2017-07-17 — End: 2017-07-17
  Filled 2017-07-17: qty 10

## 2017-07-17 MED ORDER — CHLORHEXIDINE GLUCONATE 4 % EX LIQD: 60.0000 mL | Freq: Once | CUTANEOUS | Status: DC

## 2017-07-17 MED ORDER — LACTATED RINGERS IV SOLN
INTRAVENOUS | Status: DC
  Administered 2017-07-17 (×2): via INTRAVENOUS

## 2017-07-17 MED ORDER — CEFAZOLIN SODIUM-DEXTROSE 2-4 GM/100ML-% IV SOLN
2.0000 g | Freq: Four times a day (QID) | INTRAVENOUS | Status: AC
  Administered 2017-07-17 (×2): 2 g via INTRAVENOUS
  Filled 2017-07-17 (×2): qty 100

## 2017-07-17 MED ORDER — BISACODYL 10 MG RE SUPP
10.0000 mg | Freq: Every day | RECTAL | Status: DC | PRN
Start: 2017-07-17 — End: 2017-07-18

## 2017-07-17 MED ORDER — FENTANYL CITRATE (PF) 100 MCG/2ML IJ SOLN
INTRAMUSCULAR | Status: AC
  Filled 2017-07-17: qty 2

## 2017-07-17 MED ORDER — ONDANSETRON HCL 4 MG/2ML IJ SOLN: 4.0000 mg | Freq: Four times a day (QID) | INTRAMUSCULAR | Status: DC | PRN

## 2017-07-17 MED ORDER — CEFAZOLIN SODIUM-DEXTROSE 2-4 GM/100ML-% IV SOLN
2.0000 g | INTRAVENOUS | Status: AC
  Administered 2017-07-17: 2 g via INTRAVENOUS

## 2017-07-17 MED ORDER — FENTANYL CITRATE (PF) 100 MCG/2ML IJ SOLN
25.0000 ug | INTRAMUSCULAR | Status: DC | PRN
  Administered 2017-07-17 (×2): 25 ug via INTRAVENOUS

## 2017-07-17 MED ORDER — SODIUM CHLORIDE 0.9 % IR SOLN
Status: DC | PRN
  Administered 2017-07-17: 1000 mL

## 2017-07-17 MED ORDER — OXYCODONE HCL 5 MG PO TABS: 5.0000 mg | ORAL_TABLET | ORAL | 0 refills | Status: DC | PRN

## 2017-07-17 MED ORDER — DEXAMETHASONE SODIUM PHOSPHATE 10 MG/ML IJ SOLN
10.0000 mg | Freq: Once | INTRAMUSCULAR | Status: AC
  Administered 2017-07-18: 10 mg via INTRAVENOUS
  Filled 2017-07-17: qty 1

## 2017-07-17 MED ORDER — TRANEXAMIC ACID 1000 MG/10ML IV SOLN
1000.0000 mg | Freq: Once | INTRAVENOUS | Status: AC
  Administered 2017-07-17: 1000 mg via INTRAVENOUS
  Filled 2017-07-17: qty 1100

## 2017-07-17 MED ORDER — CYCLOSPORINE 0.05 % OP EMUL
1.0000 [drp] | Freq: Two times a day (BID) | OPHTHALMIC | Status: DC
  Administered 2017-07-17 – 2017-07-18 (×2): 1 [drp] via OPHTHALMIC
  Filled 2017-07-17 (×2): qty 1

## 2017-07-17 MED ORDER — MIDAZOLAM HCL 2 MG/2ML IJ SOLN
INTRAMUSCULAR | Status: AC
  Filled 2017-07-17: qty 2

## 2017-07-17 MED ORDER — LORATADINE 10 MG PO TABS
10.0000 mg | ORAL_TABLET | Freq: Every day | ORAL | Status: DC
  Administered 2017-07-18: 10 mg via ORAL
  Filled 2017-07-17: qty 1

## 2017-07-17 MED ORDER — ONDANSETRON HCL 4 MG/2ML IJ SOLN
INTRAMUSCULAR | Status: AC
  Filled 2017-07-17: qty 2

## 2017-07-17 MED ORDER — FLUTICASONE PROPIONATE 50 MCG/ACT NA SUSP: 1.0000 | Freq: Every day | NASAL | Status: DC | PRN

## 2017-07-17 MED ORDER — PHENYLEPHRINE HCL 10 MG/ML IJ SOLN
INTRAMUSCULAR | Status: DC | PRN
  Administered 2017-07-17: 40 ug/min via INTRAVENOUS

## 2017-07-17 MED ORDER — PROPOFOL 10 MG/ML IV BOLUS
INTRAVENOUS | Status: AC
  Filled 2017-07-17: qty 60

## 2017-07-17 MED ORDER — FLEET ENEMA 7-19 GM/118ML RE ENEM: 1.0000 | ENEMA | Freq: Once | RECTAL | Status: DC | PRN

## 2017-07-17 MED ORDER — METOCLOPRAMIDE HCL 5 MG/ML IJ SOLN
5.0000 mg | Freq: Three times a day (TID) | INTRAMUSCULAR | Status: DC | PRN
Start: 2017-07-17 — End: 2017-07-18

## 2017-07-17 MED ORDER — FENTANYL CITRATE (PF) 100 MCG/2ML IJ SOLN
INTRAMUSCULAR | Status: DC | PRN
  Administered 2017-07-17: 50 ug via INTRAVENOUS

## 2017-07-17 MED ORDER — BUPIVACAINE HCL (PF) 0.25 % IJ SOLN
INTRAMUSCULAR | Status: AC
  Filled 2017-07-17: qty 30

## 2017-07-17 MED ORDER — SODIUM CHLORIDE 0.9 % IV SOLN
INTRAVENOUS | Status: DC
  Administered 2017-07-17: 17:00:00 via INTRAVENOUS

## 2017-07-17 MED ORDER — ACETAMINOPHEN 10 MG/ML IV SOLN
INTRAVENOUS | Status: AC
  Filled 2017-07-17: qty 100

## 2017-07-17 MED ORDER — MIDAZOLAM HCL 2 MG/2ML IJ SOLN
INTRAMUSCULAR | Status: AC
Start: 2017-07-17 — End: 2017-07-17
  Administered 2017-07-17: 2 mg via INTRAVENOUS
  Filled 2017-07-17: qty 2

## 2017-07-17 MED ORDER — METHOCARBAMOL 500 MG PO TABS: 500.0000 mg | ORAL_TABLET | Freq: Four times a day (QID) | ORAL | 0 refills | Status: DC | PRN

## 2017-07-17 MED ORDER — OXYCODONE HCL 5 MG PO TABS
10.0000 mg | ORAL_TABLET | ORAL | Status: DC | PRN
  Administered 2017-07-17 – 2017-07-18 (×6): 10 mg via ORAL
  Filled 2017-07-17 (×6): qty 2

## 2017-07-17 MED ORDER — ALPRAZOLAM 0.5 MG PO TABS: 0.5000 mg | ORAL_TABLET | Freq: Every evening | ORAL | Status: DC | PRN

## 2017-07-17 MED ORDER — GABAPENTIN 300 MG PO CAPS
300.0000 mg | ORAL_CAPSULE | Freq: Once | ORAL | Status: AC
  Administered 2017-07-17: 300 mg via ORAL

## 2017-07-17 MED ORDER — ACETAMINOPHEN 10 MG/ML IV SOLN
1000.0000 mg | Freq: Once | INTRAVENOUS | Status: AC
  Administered 2017-07-17: 1000 mg via INTRAVENOUS

## 2017-07-17 MED ORDER — METHOCARBAMOL 1000 MG/10ML IJ SOLN
500.0000 mg | Freq: Four times a day (QID) | INTRAVENOUS | Status: DC | PRN
  Administered 2017-07-17: 500 mg via INTRAVENOUS
  Filled 2017-07-17: qty 550

## 2017-07-17 MED ORDER — MORPHINE SULFATE (PF) 4 MG/ML IV SOLN: 1.0000 mg | INTRAVENOUS | Status: DC | PRN

## 2017-07-17 MED ORDER — DEXAMETHASONE SODIUM PHOSPHATE 10 MG/ML IJ SOLN
INTRAMUSCULAR | Status: AC
  Filled 2017-07-17: qty 1

## 2017-07-17 MED ORDER — GABAPENTIN 300 MG PO CAPS
ORAL_CAPSULE | ORAL | Status: AC
  Administered 2017-07-17: 300 mg via ORAL
  Filled 2017-07-17: qty 1

## 2017-07-17 MED ORDER — OXYCODONE HCL 5 MG PO TABS: 5.0000 mg | ORAL_TABLET | ORAL | Status: DC | PRN

## 2017-07-17 MED ORDER — FENTANYL CITRATE (PF) 100 MCG/2ML IJ SOLN: 100.0000 ug | Freq: Once | INTRAMUSCULAR | Status: DC

## 2017-07-17 MED ORDER — PHENYLEPHRINE HCL 10 MG/ML IJ SOLN
INTRAMUSCULAR | Status: AC
  Filled 2017-07-17: qty 1

## 2017-07-17 MED ORDER — DEXAMETHASONE SODIUM PHOSPHATE 10 MG/ML IJ SOLN
10.0000 mg | Freq: Once | INTRAMUSCULAR | Status: AC
  Administered 2017-07-17: 10 mg via INTRAVENOUS

## 2017-07-17 MED ORDER — TRAZODONE HCL 50 MG PO TABS
50.0000 mg | ORAL_TABLET | Freq: Every day | ORAL | Status: DC
  Filled 2017-07-17: qty 1

## 2017-07-17 MED ORDER — SODIUM CHLORIDE 0.9 % IJ SOLN
INTRAMUSCULAR | Status: AC
  Filled 2017-07-17: qty 50

## 2017-07-17 MED ORDER — BUPIVACAINE LIPOSOME 1.3 % IJ SUSP
INTRAMUSCULAR | Status: DC | PRN
  Administered 2017-07-17: 20 mL

## 2017-07-17 MED ORDER — METOCLOPRAMIDE HCL 5 MG PO TABS: 5.0000 mg | ORAL_TABLET | Freq: Three times a day (TID) | ORAL | Status: DC | PRN

## 2017-07-17 MED ORDER — SODIUM CHLORIDE 0.9 % IJ SOLN
INTRAMUSCULAR | Status: DC | PRN
  Administered 2017-07-17: 60 mL

## 2017-07-17 MED ORDER — CEFAZOLIN SODIUM-DEXTROSE 2-4 GM/100ML-% IV SOLN
INTRAVENOUS | Status: AC
  Filled 2017-07-17: qty 100

## 2017-07-17 MED ORDER — ONDANSETRON HCL 4 MG PO TABS: 4.0000 mg | ORAL_TABLET | Freq: Four times a day (QID) | ORAL | Status: DC | PRN

## 2017-07-17 MED ORDER — ROPIVACAINE HCL 7.5 MG/ML IJ SOLN
INTRAMUSCULAR | Status: DC | PRN
  Administered 2017-07-17: 20 mL via PERINEURAL

## 2017-07-17 MED ORDER — TRAMADOL HCL 50 MG PO TABS: 50.0000 mg | ORAL_TABLET | Freq: Four times a day (QID) | ORAL | Status: DC | PRN

## 2017-07-17 MED ORDER — PROPOFOL 500 MG/50ML IV EMUL
INTRAVENOUS | Status: DC | PRN
  Administered 2017-07-17: 75 ug/kg/min via INTRAVENOUS

## 2017-07-17 MED ORDER — METHOCARBAMOL 500 MG PO TABS
500.0000 mg | ORAL_TABLET | Freq: Four times a day (QID) | ORAL | Status: DC | PRN
  Administered 2017-07-17 – 2017-07-18 (×3): 500 mg via ORAL
  Filled 2017-07-17 (×3): qty 1

## 2017-07-17 MED ORDER — MIDAZOLAM HCL 2 MG/2ML IJ SOLN
2.0000 mg | Freq: Once | INTRAMUSCULAR | Status: AC
  Administered 2017-07-17: 2 mg via INTRAVENOUS

## 2017-07-17 MED ORDER — ONDANSETRON HCL 4 MG/2ML IJ SOLN: 4.0000 mg | Freq: Once | INTRAMUSCULAR | Status: DC | PRN

## 2017-07-17 MED ORDER — DIPHENHYDRAMINE HCL 12.5 MG/5ML PO ELIX: 12.5000 mg | ORAL_SOLUTION | ORAL | Status: DC | PRN

## 2017-07-17 MED ORDER — RIVAROXABAN 10 MG PO TABS: 10.0000 mg | ORAL_TABLET | Freq: Every day | ORAL | 0 refills | Status: DC

## 2017-07-17 MED ORDER — BUPIVACAINE LIPOSOME 1.3 % IJ SUSP
20.0000 mL | Freq: Once | INTRAMUSCULAR | Status: DC
  Filled 2017-07-17: qty 20

## 2017-07-17 MED ORDER — ACETAMINOPHEN 500 MG PO TABS
1000.0000 mg | ORAL_TABLET | Freq: Four times a day (QID) | ORAL | Status: DC
  Administered 2017-07-17 – 2017-07-18 (×3): 1000 mg via ORAL
  Filled 2017-07-17 (×3): qty 2

## 2017-07-17 MED ORDER — ONDANSETRON HCL 4 MG/2ML IJ SOLN
INTRAMUSCULAR | Status: DC | PRN
  Administered 2017-07-17: 4 mg via INTRAVENOUS

## 2017-07-17 MED ORDER — TRAMADOL HCL 50 MG PO TABS: 50.0000 mg | ORAL_TABLET | Freq: Four times a day (QID) | ORAL | 0 refills | Status: DC | PRN

## 2017-07-17 MED ORDER — 0.9 % SODIUM CHLORIDE (POUR BTL) OPTIME
TOPICAL | Status: DC | PRN
  Administered 2017-07-17: 1000 mL

## 2017-07-17 MED ORDER — PROPOFOL 10 MG/ML IV BOLUS
INTRAVENOUS | Status: DC | PRN
  Administered 2017-07-17: 30 mg via INTRAVENOUS

## 2017-07-17 MED ORDER — RIVAROXABAN 10 MG PO TABS
10.0000 mg | ORAL_TABLET | Freq: Every day | ORAL | Status: DC
  Administered 2017-07-18: 10 mg via ORAL
  Filled 2017-07-17: qty 1

## 2017-07-17 MED ORDER — MIDAZOLAM HCL 5 MG/5ML IJ SOLN
INTRAMUSCULAR | Status: DC | PRN
  Administered 2017-07-17: 2 mg via INTRAVENOUS

## 2017-07-17 MED ORDER — PHENOL 1.4 % MT LIQD
1.0000 | OROMUCOSAL | Status: DC | PRN
  Filled 2017-07-17: qty 177

## 2017-07-17 MED ORDER — BUPIVACAINE IN DEXTROSE 0.75-8.25 % IT SOLN
INTRATHECAL | Status: DC | PRN
  Administered 2017-07-17: 1.6 mL via INTRATHECAL

## 2017-07-17 MED ORDER — ACETAMINOPHEN 650 MG RE SUPP: 650.0000 mg | RECTAL | Status: DC | PRN

## 2017-07-17 MED ORDER — DOCUSATE SODIUM 100 MG PO CAPS
100.0000 mg | ORAL_CAPSULE | Freq: Two times a day (BID) | ORAL | Status: DC
  Administered 2017-07-17 – 2017-07-18 (×2): 100 mg via ORAL
  Filled 2017-07-17 (×2): qty 1

## 2017-07-17 MED ORDER — TRANEXAMIC ACID 1000 MG/10ML IV SOLN
1000.0000 mg | INTRAVENOUS | Status: AC
  Administered 2017-07-17: 1000 mg via INTRAVENOUS
  Filled 2017-07-17: qty 1100

## 2017-07-17 MED ORDER — ACETAMINOPHEN 325 MG PO TABS: 650.0000 mg | ORAL_TABLET | ORAL | Status: DC | PRN

## 2017-07-17 SURGICAL SUPPLY — 46 items
ATTUNE PSRP INSR SZ3 10 KNEE (Insert) ×2 IMPLANT
BAG SPEC THK2 15X12 ZIP CLS (MISCELLANEOUS) ×1
BAG ZIPLOCK 12X15 (MISCELLANEOUS) ×1 IMPLANT
BANDAGE ACE 6X5 VEL STRL LF (GAUZE/BANDAGES/DRESSINGS) ×2 IMPLANT
CLOTH BEACON ORANGE TIMEOUT ST (SAFETY) ×2 IMPLANT
COVER SURGICAL LIGHT HANDLE (MISCELLANEOUS) ×2 IMPLANT
CUFF TOURN SGL QUICK 34 (TOURNIQUET CUFF) ×2
CUFF TRNQT CYL 34X4X40X1 (TOURNIQUET CUFF) ×1 IMPLANT
DRAPE U-SHAPE 47X51 STRL (DRAPES) ×2 IMPLANT
DRSG ADAPTIC 3X8 NADH LF (GAUZE/BANDAGES/DRESSINGS) ×2 IMPLANT
DURAPREP 26ML APPLICATOR (WOUND CARE) ×2 IMPLANT
ELECT REM PT RETURN 15FT ADLT (MISCELLANEOUS) ×2 IMPLANT
EVACUATOR 1/8 PVC DRAIN (DRAIN) ×2 IMPLANT
GAUZE SPONGE 4X4 12PLY STRL (GAUZE/BANDAGES/DRESSINGS) ×2 IMPLANT
GLOVE BIOGEL PI IND STRL 7.0 (GLOVE) IMPLANT
GLOVE BIOGEL PI IND STRL 7.5 (GLOVE) IMPLANT
GLOVE BIOGEL PI IND STRL 8 (GLOVE) ×1 IMPLANT
GLOVE BIOGEL PI INDICATOR 7.0 (GLOVE) ×1
GLOVE BIOGEL PI INDICATOR 7.5 (GLOVE) ×3
GLOVE BIOGEL PI INDICATOR 8 (GLOVE) ×1
GLOVE SURG SS PI 6.5 STRL IVOR (GLOVE) IMPLANT
GLOVE SURG SS PI 7.0 STRL IVOR (GLOVE) ×1 IMPLANT
GLOVE SURG SS PI 7.5 STRL IVOR (GLOVE) ×4 IMPLANT
GLOVE SURG SS PI 8.0 STRL IVOR (GLOVE) ×2 IMPLANT
GOWN SPEC L3 XXLG W/TWL (GOWN DISPOSABLE) ×1 IMPLANT
GOWN STRL REUS W/ TWL XL LVL3 (GOWN DISPOSABLE) IMPLANT
GOWN STRL REUS W/TWL LRG LVL3 (GOWN DISPOSABLE) ×2 IMPLANT
GOWN STRL REUS W/TWL XL LVL3 (GOWN DISPOSABLE) ×3 IMPLANT
HANDPIECE INTERPULSE COAX TIP (DISPOSABLE) ×2
IMMOBILIZER KNEE 20 (SOFTGOODS) ×2
IMMOBILIZER KNEE 20 THIGH 36 (SOFTGOODS) ×1 IMPLANT
MANIFOLD NEPTUNE II (INSTRUMENTS) ×2 IMPLANT
PACK TOTAL KNEE CUSTOM (KITS) ×2 IMPLANT
PAD ABD 8X10 STRL (GAUZE/BANDAGES/DRESSINGS) ×1 IMPLANT
PADDING CAST COTTON 6X4 STRL (CAST SUPPLIES) ×6 IMPLANT
POSITIONER SURGICAL ARM (MISCELLANEOUS) ×2 IMPLANT
SET HNDPC FAN SPRY TIP SCT (DISPOSABLE) ×1 IMPLANT
STRIP CLOSURE SKIN 1/2X4 (GAUZE/BANDAGES/DRESSINGS) ×2 IMPLANT
SUT STRATAFIX 0 PDS 27 VIOLET (SUTURE) ×2
SUT VIC AB 2-0 CT1 27 (SUTURE) ×6
SUT VIC AB 2-0 CT1 TAPERPNT 27 (SUTURE) ×3 IMPLANT
SUTURE STRATFX 0 PDS 27 VIOLET (SUTURE) ×1 IMPLANT
SYR 50ML LL SCALE MARK (SYRINGE) ×4 IMPLANT
TOWER CARTRIDGE SMART MIX (DISPOSABLE) ×2 IMPLANT
TRAY FOLEY BAG SILVER LF 16FR (SET/KITS/TRAYS/PACK) ×1 IMPLANT
WRAP KNEE MAXI GEL POST OP (GAUZE/BANDAGES/DRESSINGS) ×2 IMPLANT

## 2017-07-17 NOTE — Anesthesia Postprocedure Evaluation (Signed)
Anesthesia Post Note  Patient: Emily Ballard  Procedure(s) Performed: LEFT KNEE POLYETHYLENE REVISION (Left Knee)     Patient location during evaluation: PACU Anesthesia Type: Regional and Spinal Level of consciousness: oriented and awake and alert Pain management: pain level controlled Vital Signs Assessment: post-procedure vital signs reviewed and stable Respiratory status: spontaneous breathing, respiratory function stable and patient connected to nasal cannula oxygen Cardiovascular status: blood pressure returned to baseline and stable Postop Assessment: no headache, no backache and no apparent nausea or vomiting Anesthetic complications: no    Last Vitals:  Vitals:   07/17/17 1530 07/17/17 1553  BP: (!) 148/91 (!) 136/92  Pulse: 72 72  Resp: 13 14  Temp: 36.5 C 36.4 C  SpO2: 100% 100%    Last Pain:  Vitals:   07/17/17 1647  TempSrc:   PainSc: 5                  Ryan P Ellender

## 2017-07-17 NOTE — Brief Op Note (Signed)
07/17/2017  1:44 PM  PATIENT:  Emily Ballard  56 y.o. female  PRE-OPERATIVE DIAGNOSIS:  Unstable left total knee arthroplasty   POST-OPERATIVE DIAGNOSIS:  Unstable left total knee arthroplasty   PROCEDURE:  Procedure(s): LEFT KNEE POLYETHYLENE REVISION (Left)  SURGEON:  Surgeon(s) and Role:    Gaynelle Arabian, MD - Primary  PHYSICIAN ASSISTANT:   ASSISTANTS: Arlee Muslim, PA-C   ANESTHESIA:   Adductor canal block and spinal  EBL:  50 ml   BLOOD ADMINISTERED:none  DRAINS: (Medium) Hemovact drain(s) in the left knee with  Suction Open   LOCAL MEDICATIONS USED:  OTHER Exparel  COUNTS:  YES  TOURNIQUET:   Total Tourniquet Time Documented: Thigh (Left) - 26 minutes Total: Thigh (Left) - 26 minutes   DICTATION: .Other Dictation: Dictation Number [id number]  PLAN OF CARE: Admit to inpatient   PATIENT DISPOSITION:  PACU - hemodynamically stable.

## 2017-07-17 NOTE — Transfer of Care (Signed)
Immediate Anesthesia Transfer of Care Note  Patient: Emily Ballard  Procedure(s) Performed: Procedure(s): LEFT KNEE POLYETHYLENE REVISION (Left)  Patient Location: PACU  Anesthesia Type:Spinal  Level of Consciousness:  sedated, patient cooperative and responds to stimulation  Airway & Oxygen Therapy:Patient Spontanous Breathing and Patient connected to face mask oxgen  Post-op Assessment:  Report given to PACU RN and Post -op Vital signs reviewed and stable  Post vital signs:  Reviewed and stable  Last Vitals:  Vitals:   07/17/17 1149 07/17/17 1204  BP: (!) 141/87 (!) 146/92  Pulse: 93 96  Resp: 19 19  Temp:    SpO2: 25% 427%    Complications: No apparent anesthesia complications

## 2017-07-17 NOTE — Interval H&P Note (Signed)
History and Physical Interval Note:  07/17/2017 11:04 AM  Emily Ballard  has presented today for surgery, with the diagnosis of Unstable left total knee arthroplasty   The various methods of treatment have been discussed with the patient and family. After consideration of risks, benefits and other options for treatment, the patient has consented to  Procedure(s): Left knee polyethylene versus total knee arthroplasty revision (Left) as a surgical intervention .  The patient's history has been reviewed, patient examined, no change in status, stable for surgery.  I have reviewed the patient's chart and labs.  Questions were answered to the patient's satisfaction.     Pilar Plate Layci Stenglein

## 2017-07-17 NOTE — Discharge Instructions (Signed)
° °Dr. Bailyn Spackman °Total Joint Specialist °[facility] °[address] °[place], [place] [address] °[phone] ° °TOTAL KNEE REPLACEMENT POSTOPERATIVE DIRECTIONS ° °Knee Rehabilitation, Guidelines Following Surgery  °Results after knee surgery are often greatly improved when you follow the exercise, range of motion and muscle strengthening exercises prescribed by your doctor. Safety measures are also important to protect the knee from further injury. Any time any of these exercises cause you to have increased pain or swelling in your knee joint, decrease the amount until you are comfortable again and slowly increase them. If you have problems or questions, call your caregiver or physical therapist for advice.  ° °HOME CARE INSTRUCTIONS  °Remove items at home which could result in a fall. This includes throw rugs or furniture in walking pathways.  °· ICE to the affected knee every three hours for 30 minutes at a time and then as needed for pain and swelling.  Continue to use ice on the knee for pain and swelling from surgery. You may notice swelling that will progress down to the foot and ankle.  This is normal after surgery.  Elevate the leg when you are not up walking on it.   °· Continue to use the breathing machine which will help keep your temperature down.  It is common for your temperature to cycle up and down following surgery, especially at night when you are not up moving around and exerting yourself.  The breathing machine keeps your lungs expanded and your temperature down. °· Do not place pillow under knee, focus on keeping the knee straight while resting ° °DIET °You may resume your previous home diet once your are discharged from the hospital. ° °DRESSING / WOUND CARE / SHOWERING °You may start showering once you are discharged home but do not submerge the incision under water. Just pat the incision dry and apply a dry gauze dressing on daily. °Change the surgical dressing  daily and reapply a dry dressing each time. ° °ACTIVITY °Walk with your walker as instructed. °Use walker as long as suggested by your caregivers. °Avoid periods of inactivity such as sitting longer than an hour when not asleep. This helps prevent blood clots.  °You may resume a sexual relationship in one month or when given the OK by your doctor.  °You may return to work once you are cleared by your doctor.  °Do not drive a car for 6 weeks or until released by you surgeon.  °Do not drive while taking narcotics. ° °WEIGHT BEARING °Weight bearing as tolerated with assist device (walker, cane, etc) as directed, use it as long as suggested by your surgeon or therapist, typically at least 4-6 weeks. ° °POSTOPERATIVE CONSTIPATION PROTOCOL °Constipation - defined medically as fewer than three stools per week and severe constipation as less than one stool per week. ° °One of the most common issues patients have following surgery is constipation.  Even if you have a regular bowel pattern at home, your normal regimen is likely to be disrupted due to multiple reasons following surgery.  Combination of anesthesia, postoperative narcotics, change in appetite and fluid intake all can affect your bowels.  In order to avoid complications following surgery, here are some recommendations in order to help you during your recovery period. ° °Colace (docusate) - Pick up an over-the-counter form of Colace or another stool softener and take twice a day as long as you are requiring postoperative pain medications.  Take with a full glass of water   daily.  If you experience loose stools or diarrhea, hold the colace until you stool forms back up.  If your symptoms do not get better within 1 week or if they get worse, check with your doctor. ° °Dulcolax (bisacodyl) - Pick up over-the-counter and take as directed by the product packaging as needed to assist with the movement of your bowels.  Take with a full glass of water.  Use this product as  needed if not relieved by Colace only.  ° °MiraLax (polyethylene glycol) - Pick up over-the-counter to have on hand.  MiraLax is a solution that will increase the amount of water in your bowels to assist with bowel movements.  Take as directed and can mix with a glass of water, juice, soda, coffee, or tea.  Take if you go more than two days without a movement. °Do not use MiraLax more than once per day. Call your doctor if you are still constipated or irregular after using this medication for 7 days in a row. ° °If you continue to have problems with postoperative constipation, please contact the office for further assistance and recommendations.  If you experience "the worst abdominal pain ever" or develop nausea or vomiting, please contact the office immediatly for further recommendations for treatment. ° °ITCHING ° If you experience itching with your medications, try taking only a single pain pill, or even half a pain pill at a time.  You can also use Benadryl over the counter for itching or also to help with sleep.  ° °TED HOSE STOCKINGS °Wear the elastic stockings on both legs for three weeks following surgery during the day but you may remove then at night for sleeping. ° °MEDICATIONS °See your medication summary on the “After Visit Summary” that the nursing staff will review with you prior to discharge.  You may have some home medications which will be placed on hold until you complete the course of blood thinner medication.  It is important for you to complete the blood thinner medication as prescribed by your surgeon.  Continue your approved medications as instructed at time of discharge. ° °PRECAUTIONS °If you experience chest pain or shortness of breath - call 911 immediately for transfer to the hospital emergency department.  °If you develop a fever greater that 101 F, purulent drainage from wound, increased redness or drainage from wound, foul odor from the wound/dressing, or calf pain - CONTACT YOUR  SURGEON.   °                                                °FOLLOW-UP APPOINTMENTS °Make sure you keep all of your appointments after your operation with your surgeon and caregivers. You should call the office at the above phone number and make an appointment for approximately two weeks after the date of your surgery or on the date instructed by your surgeon outlined in the "After Visit Summary". ° ° °RANGE OF MOTION AND STRENGTHENING EXERCISES  °Rehabilitation of the knee is important following a knee injury or an operation. After just a few days of immobilization, the muscles of the thigh which control the knee become weakened and shrink (atrophy). Knee exercises are designed to build up the tone and strength of the thigh muscles and to improve knee motion. Often times heat used for twenty to thirty minutes before working out will loosen   up your tissues and help with improving the range of motion but do not use heat for the first two weeks following surgery. These exercises can be done on a training (exercise) mat, on the floor, on a table or on a bed. Use what ever works the best and is most comfortable for you Knee exercises include:  °Leg Lifts - While your knee is still immobilized in a splint or cast, you can do straight leg raises. Lift the leg to 60 degrees, hold for 3 sec, and slowly lower the leg. Repeat 10-20 times 2-3 times daily. Perform this exercise against resistance later as your knee gets better.  °Quad and Hamstring Sets - Tighten up the muscle on the front of the thigh (Quad) and hold for 5-10 sec. Repeat this 10-20 times hourly. Hamstring sets are done by pushing the foot backward against an object and holding for 5-10 sec. Repeat as with quad sets.  °· Leg Slides: Lying on your back, slowly slide your foot toward your buttocks, bending your knee up off the floor (only go as far as is comfortable). Then slowly slide your foot back down until your leg is flat on the floor again. °· Angel Wings:  Lying on your back spread your legs to the side as far apart as you can without causing discomfort.  °A rehabilitation program following serious knee injuries can speed recovery and prevent re-injury in the future due to weakened muscles. Contact your doctor or a physical therapist for more information on knee rehabilitation.  ° °IF YOU ARE TRANSFERRED TO A SKILLED REHAB FACILITY °If the patient is transferred to a skilled rehab facility following release from the hospital, a list of the current medications will be sent to the facility for the patient to continue.  When discharged from the skilled rehab facility, please have the facility set up the patient's Home Health Physical Therapy prior to being released. Also, the skilled facility will be responsible for providing the patient with their medications at time of release from the facility to include their pain medication, the muscle relaxants, and their blood thinner medication. If the patient is still at the rehab facility at time of the two week follow up appointment, the skilled rehab facility will also need to assist the patient in arranging follow up appointment in our office and any transportation needs. ° °MAKE SURE YOU:  °Understand these instructions.  °Get help right away if you are not doing well or get worse.  ° ° °Pick up stool softner and laxative for home use following surgery while on pain medications. °Do not submerge incision under water. °Please use good hand washing techniques while changing dressing each day. °May shower starting three days after surgery. °Please use a clean towel to pat the incision dry following showers. °Continue to use ice for pain and swelling after surgery. °Do not use any lotions or creams on the incision until instructed by your surgeon. ° ° °Information on my medicine - XARELTO® (Rivaroxaban) ° °Why was Xarelto® prescribed for you? °Xarelto® was prescribed for you to reduce the risk of blood clots forming after  orthopedic surgery. The medical term for these abnormal blood clots is venous thromboembolism (VTE). ° °What do you need to know about xarelto® ? °Take your Xarelto® ONCE DAILY at the same time every day. °You may take it either with or without food. ° °If you have difficulty swallowing the tablet whole, you may crush it and mix in applesauce just prior to   taking your dose. ° °Take Xarelto® exactly as prescribed by your doctor and DO NOT stop taking Xarelto® without talking to the doctor who prescribed the medication.  Stopping without other VTE prevention medication to take the place of Xarelto® may increase your risk of developing a clot. ° °After discharge, you should have regular check-up appointments with your healthcare provider that is prescribing your Xarelto®.   ° °What do you do if you miss a dose? °If you miss a dose, take it as soon as you remember on the same day then continue your regularly scheduled once daily regimen the next day. Do not take two doses of Xarelto® on the same day.  ° °Important Safety Information °A possible side effect of Xarelto® is bleeding. You should call your healthcare provider right away if you experience any of the following: °? Bleeding from an injury or your nose that does not stop. °? Unusual colored urine (red or dark brown) or unusual colored stools (red or black). °? Unusual bruising for unknown reasons. °? A serious fall or if you hit your head (even if there is no bleeding). ° °Some medicines may interact with Xarelto® and might increase your risk of bleeding while on Xarelto®. To help avoid this, consult your healthcare provider or pharmacist prior to using any new prescription or non-prescription medications, including herbals, vitamins, non-steroidal anti-inflammatory drugs (NSAIDs) and supplements. ° °This website has more information on Xarelto®: www.xarelto.com. °

## 2017-07-17 NOTE — Progress Notes (Signed)
Orthopedic Tech Progress Note Patient Details:  Emily Ballard September 01, 1960 [id number] Pt was asked not to be put on CPM and I was told by the doctor it's been DC. Patient ID: [name], female   DOB: 17-Jun-1961, 56 y.o.   MRN: [id number]   Ladell Pier [facility] 07/17/2017, 5:03 PM

## 2017-07-17 NOTE — Anesthesia Preprocedure Evaluation (Addendum)
Anesthesia Evaluation  Patient identified by MRN, date of birth, ID band Patient awake    Reviewed: Allergy & Precautions, NPO status , Patient's Chart, lab work & pertinent test results  History of Anesthesia Complications (+) PONV  Airway Mallampati: II  TM Distance: >3 FB Neck ROM: Full    Dental no notable dental hx.    Pulmonary former smoker,    Pulmonary exam normal breath sounds clear to auscultation       Cardiovascular negative cardio ROS Normal cardiovascular exam Rhythm:Regular Rate:Normal  ECG: NSR, rate 75   Neuro/Psych Anxiety negative neurological ROS     GI/Hepatic Neg liver ROS, GERD  Controlled,  Endo/Other  negative endocrine ROS  Renal/GU negative Renal ROS  negative genitourinary   Musculoskeletal  (+) Arthritis , Osteoarthritis,    Abdominal   Peds negative pediatric ROS (+)  Hematology negative hematology ROS (+)   Anesthesia Other Findings   Reproductive/Obstetrics negative OB ROS                            Anesthesia Physical  Anesthesia Plan  ASA: II  Anesthesia Plan: Spinal and Regional   Post-op Pain Management:  Regional for Post-op pain   Induction: Intravenous  PONV Risk Score and Plan: 3 and Dexamethasone, Ondansetron and Midazolam  Airway Management Planned: Natural Airway  Additional Equipment:   Intra-op Plan:   Post-operative Plan:   Informed Consent: I have reviewed the patients History and Physical, chart, labs and discussed the procedure including the risks, benefits and alternatives for the proposed anesthesia with the patient or authorized representative who has indicated his/her understanding and acceptance.   Dental advisory given  Plan Discussed with: CRNA  Anesthesia Plan Comments:       Anesthesia Quick Evaluation

## 2017-07-17 NOTE — Progress Notes (Signed)
Assisted Dr. Ellender with left, ultrasound guided, adductor canal block. Side rails up, monitors on throughout procedure. See vital signs in flow sheet. Tolerated Procedure well.  

## 2017-07-17 NOTE — Anesthesia Procedure Notes (Signed)
Anesthesia Regional Block: Adductor canal block   Pre-Anesthetic Checklist: ,, timeout performed, Correct Patient, Correct Site, Correct Laterality, Correct Procedure,, site marked, risks and benefits discussed, Surgical consent,  Pre-op evaluation,  At surgeon's request and post-op pain management  Laterality: Left  Prep: chloraprep       Needles:  Injection technique: Single-shot  Needle Type: Echogenic Stimulator Needle     Needle Length: 9cm  Needle Gauge: 21     Additional Needles:   Procedures:, nerve stimulator,,, ultrasound used (permanent image in chart), intact distal pulses,,,  Narrative:  Start time: 07/17/2017 11:40 AM End time: 07/17/2017 11:50 AM Injection made incrementally with aspirations every 5 mL.  Performed by: Personally  Anesthesiologist: Murvin Natal, MD  Additional Notes: Functioning IV was confirmed and monitors were applied.  A 66mm 21ga Arrow echogenic stimulator needle was used. Sterile prep, hand hygiene and sterile gloves were used.  Negative aspiration and negative test dose prior to incremental administration of local anesthetic. The patient tolerated the procedure well.

## 2017-07-17 NOTE — Anesthesia Procedure Notes (Signed)
Spinal  Patient location during procedure: OR Start time: 07/17/2017 12:40 PM End time: 07/17/2017 12:46 PM Reason for block: at surgeon's request Staffing Resident/CRNA: Anne Fu, CRNA Performed: resident/CRNA  Preanesthetic Checklist Completed: patient identified, site marked, surgical consent, pre-op evaluation, timeout performed, IV checked, risks and benefits discussed and monitors and equipment checked Spinal Block Patient position: sitting Prep: DuraPrep Patient monitoring: heart rate, continuous pulse ox and blood pressure Approach: right paramedian Location: L2-3 Injection technique: single-shot Needle Needle type: Pencan  Needle gauge: 24 G Needle length: 9 cm Assessment Sensory level: T6 Additional Notes Expiration date of kit checked and confirmed. Patient tolerated procedure well, without complications. X 1 attempt with noted clear CSF return. Loss of motor and sensory on exam post injection.

## 2017-07-18 LAB — CBC
HEMATOCRIT: 37.4 % (ref 36.0–46.0)
HEMOGLOBIN: 12.6 g/dL (ref 12.0–15.0)
MCH: 30.4 pg (ref 26.0–34.0)
MCHC: 33.7 g/dL (ref 30.0–36.0)
MCV: 90.3 fL (ref 78.0–100.0)
Platelets: 269 10*3/uL (ref 150–400)
RBC: 4.14 MIL/uL (ref 3.87–5.11)
RDW: 12.3 % (ref 11.5–15.5)
WBC: 11.8 10*3/uL — ABNORMAL HIGH (ref 4.0–10.5)

## 2017-07-18 LAB — BASIC METABOLIC PANEL
Anion gap: 6 (ref 5–15)
BUN: 7 mg/dL (ref 6–20)
CALCIUM: 8.5 mg/dL — AB (ref 8.9–10.3)
CHLORIDE: 105 mmol/L (ref 101–111)
CO2: 27 mmol/L (ref 22–32)
CREATININE: 0.76 mg/dL (ref 0.44–1.00)
GFR calc non Af Amer: >60 mL/min (ref >60)
GLUCOSE: 128 mg/dL — AB (ref 65–99)
Potassium: 4.1 mmol/L (ref 3.5–5.1)
Sodium: 138 mmol/L (ref 135–145)

## 2017-07-18 MED ORDER — TRAMADOL HCL 50 MG PO TABS: 50.0000 mg | ORAL_TABLET | Freq: Four times a day (QID) | ORAL | 0 refills | Status: DC | PRN

## 2017-07-18 MED ORDER — TRAMADOL HCL 50 MG PO TABS: 50.0000 mg | ORAL_TABLET | Freq: Four times a day (QID) | ORAL | Status: DC | PRN

## 2017-07-18 NOTE — Progress Notes (Signed)
Subjective: 1 Day Post-Op Procedure(s) (LRB): LEFT KNEE POLYETHYLENE REVISION (Left) Patient reports pain as mild.    Objective: Vital signs in last 24 hours: Temp:  [97.5 F (36.4 C)-98.6 F (37 C)] 97.6 F (36.4 C) (11/22 0554) Pulse Rate:  [56-117] 65 (11/22 0554) Resp:  [9-25] 16 (11/22 0554) BP: (95-164)/(63-104) 103/71 (11/22 0554) SpO2:  [97 %-100 %] 99 % (11/22 0554) Weight:  [136 lb (61.7 kg)] 136 lb (61.7 kg) (11/21 1100)  Intake/Output from previous day: 11/21 0701 - 11/22 0700 In: 3897.5 [P.O.:960; I.V.:2577.5; IV Piggyback:360] Out: 5010 [Urine:4800; Drains:110; Blood:100] Intake/Output this shift: No intake/output data recorded.  Recent Labs    07/18/17 0601  HGB 12.6   Recent Labs    07/18/17 0601  WBC 11.8*  RBC 4.14  HCT 37.4  PLT 269   Recent Labs    07/18/17 0601  NA 138  K 4.1  CL 105  CO2 27  BUN 7  CREATININE 0.76  GLUCOSE 128*  CALCIUM 8.5*   No results for input(s): LABPT, INR in the last 72 hours.  Neurologically intact Neurovascular intact No cellulitis present Compartment soft  Assessment/Plan: 1 Day Post-Op Procedure(s) (LRB): LEFT KNEE POLYETHYLENE REVISION (Left) Advance diet Up with therapy D/C IV fluids Discharge home with home health  Gaynelle Arabian 07/18/2017, 7:18 AM

## 2017-07-18 NOTE — Progress Notes (Signed)
Patient discharged to home w/ family. Given all belongings, instructions, prescriptions. Husband present for all teaching. Escorted to pov via w/c.

## 2017-07-18 NOTE — Progress Notes (Signed)
Occupational Therapy Evaluation Patient Details Name: Emily Ballard MRN: [id number] DOB: July 03, 1961 Today's Date: 07/18/2017    History of Present Illness 56 yo s/p LEFT KNEE POLYETHYLENE REVISION due to unstable total knee.   Clinical Impression   Completed education regarding shower transfers, compensatory techniques for ADL and home safety. Pt demonstrated understanding. No DME needs. Pt safe to DC home when medically stable.     Follow Up Recommendations  No OT follow up;Supervision - Intermittent    Equipment Recommendations  None recommended by OT    Recommendations for Other Services       Precautions / Restrictions Precautions Precautions: Knee Restrictions Weight Bearing Restrictions: No      Mobility Bed Mobility Overal bed mobility: Modified Independent                Transfers                 General transfer comment: S    Balance                                           ADL either performed or assessed with clinical judgement   ADL Overall ADL's : Needs assistance/impaired                                       General ADL Comments: Pt states she has been up walking to the bathroom. Pt asking about shower transfer. Educated pt on technique for safe walk in shower transfer. Pt has a shower sat to use. Reviewed compensatory techniques for ADL and home set up to maximize independence. Pt askin gabout use of "bone foam" - educated on importance of terminal extension without use of foam. Pt/husband verbalized understanidng. Husband asking about activity tolerance. Encourged pt to increase activity slowly and to not overdue activity. Discussed compensatory strategies to completing activities.      Vision         Perception     Praxis      Pertinent Vitals/Pain Pain Assessment: Faces Faces Pain Scale: Hurts a little bit Pain Location: knee Pain Descriptors / Indicators: Sore Pain Intervention(s):  Limited activity within patient's tolerance(encouraged ice)     Hand Dominance     Extremity/Trunk Assessment Upper Extremity Assessment Upper Extremity Assessment: Overall WFL for tasks assessed   Lower Extremity Assessment Lower Extremity Assessment: Defer to PT evaluation   Cervical / Trunk Assessment Cervical / Trunk Assessment: Normal   Communication     Cognition Arousal/Alertness: Awake/alert Behavior During Therapy: WFL for tasks assessed/performed Overall Cognitive Status: Within Functional Limits for tasks assessed                                     General Comments       Exercises     Shoulder Instructions      Home Living Family/patient expects to be discharged to:: Private residence Living Arrangements: Spouse/significant other Available Help at Discharge: Family;Available 24 hours/day Type of Home: House Home Access: Stairs to enter [employer] of Steps: 1 Entrance Stairs-Rails: None Home Layout: Able to live on main level with bedroom/bathroom     Bathroom Shower/Tub: Occupational psychologist: Handicapped height Bathroom Accessibility:  Yes How Accessible: Accessible via walker Home Equipment: [place] - 2 wheels;Shower seat          Prior Functioning/Environment Level of Independence: Independent                 OT Problem List: Decreased strength;Decreased range of motion;Decreased knowledge of use of DME or AE;Decreased knowledge of precautions;Pain      OT Treatment/Interventions:      OT Goals(Current goals can be found in the care plan section) Acute Rehab OT Goals Patient Stated Goal: to go home OT Goal Formulation: All assessment and education complete, DC therapy  OT Frequency:     Barriers to D/C:            Co-evaluation              AM-PAC PT "6 Clicks" Daily Activity     Outcome Measure Help from another person eating meals?: None Help from another person taking care of  personal grooming?: None Help from another person toileting, which includes using toliet, bedpan, or urinal?: None Help from another person bathing (including washing, rinsing, drying)?: None Help from another person to put on and taking off regular upper body clothing?: None Help from another person to put on and taking off regular lower body clothing?: A Little 6 Click Score: 23   End of Session Nurse Communication: Mobility status  Activity Tolerance: Patient tolerated treatment well Patient left: in bed;with call bell/phone within reach;with family/visitor present  OT Visit Diagnosis: Other abnormalities of gait and mobility (R26.89);Pain Pain - Right/Left: Left                Time: 6237-6283 OT Time Calculation (min): 14 min Charges:  OT General Charges $OT Visit: 1 Visit OT Evaluation $OT Eval Low Complexity: 1 Low G-Codes:     [place], OT/L  [phone] 07/18/2017  Emily Ballard,HILLARY 07/18/2017, 10:31 AM

## 2017-07-18 NOTE — Evaluation (Signed)
Physical Therapy Evaluation Patient Details Name: Emily Ballard MRN: [id number] DOB: April 10, 1961 Today's Date: 07/18/2017   History of Present Illness  56 yo s/p LEFT KNEE POLYETHYLENE REVISION due to unstable total knee.  Clinical Impression  Pt s/p L TKR revision and presents with decreased L LE strength/ROM and post op pain limiting functional mobility.  Pt should progress to dc home with family assist.    Follow Up Recommendations DC plan and follow up therapy as arranged by surgeon    Equipment Recommendations  None recommended by PT    Recommendations for Other Services       Precautions / Restrictions Precautions Precautions: Knee Restrictions Weight Bearing Restrictions: No Other Position/Activity Restrictions: WBAT      Mobility  Bed Mobility Overal bed mobility: Modified Independent                Transfers Overall transfer level: Needs assistance Equipment used: Rolling walker (2 wheeled) Transfers: Sit to/from Stand Sit to Stand: Min guard;Supervision         General transfer comment: cues for LE management and use of UEs to self assist  Ambulation/Gait Ambulation/Gait assistance: Min guard;Supervision Ambulation Distance (Feet): 140 Feet Assistive device: Rolling walker (2 wheeled) Gait Pattern/deviations: Step-to pattern;Decreased step length - right;Decreased step length - left;Shuffle;Trunk flexed Gait velocity: decr Gait velocity interpretation: Below normal speed for age/gender General Gait Details: min cues for posture and position from RW  Stairs Stairs: Yes Stairs assistance: Min assist Stair Management: No rails;Step to pattern;Forwards;With walker Number of Stairs: 2 General stair comments: single step twice with cues for sequence and foot/RW placement  Wheelchair Mobility    Modified Rankin (Stroke Patients Only)       Balance Overall balance assessment: No apparent balance deficits (not formally assessed)                                            Pertinent Vitals/Pain Pain Assessment: 0-10 Pain Score: 4  Faces Pain Scale: Hurts a little bit Pain Location: knee Pain Descriptors / Indicators: Sore Pain Intervention(s): Limited activity within patient's tolerance;Monitored during session;Premedicated before session;Ice applied    Home Living Family/patient expects to be discharged to:: Private residence Living Arrangements: Spouse/significant other Available Help at Discharge: Family;Available 24 hours/day Type of Home: House Home Access: Stairs to enter Entrance Stairs-Rails: None Entrance Stairs-Number of Steps: 1 Home Layout: Able to live on main level with bedroom/bathroom Home Equipment: Walker - 2 wheels;Shower seat      Prior Function Level of Independence: Independent               Hand Dominance        Extremity/Trunk Assessment   Upper Extremity Assessment Upper Extremity Assessment: Overall WFL for tasks assessed    Lower Extremity Assessment Lower Extremity Assessment: LLE deficits/detail LLE Deficits / Details: 3/5 quads with IND SLR and AAROM at knee -10 - 50    Cervical / Trunk Assessment Cervical / Trunk Assessment: Normal  Communication   Communication: No difficulties  Cognition Arousal/Alertness: Awake/alert Behavior During Therapy: WFL for tasks assessed/performed Overall Cognitive Status: Within Functional Limits for tasks assessed                                        General Comments  Exercises Total Joint Exercises Ankle Circles/Pumps: AROM;Both;15 reps;Supine Quad Sets: AROM;Both;10 reps;Supine Heel Slides: AAROM;Left;20 reps;Supine Straight Leg Raises: AROM;Left;10 reps;Supine Long Arc Quad: AAROM;AROM;Left;5 reps;Seated Knee Flexion: AROM;AAROM;Left;10 reps;Seated   Assessment/Plan    PT Assessment Patient needs continued PT services  PT Problem List Decreased strength;Decreased range of  motion;Decreased activity tolerance;Decreased mobility;Decreased knowledge of use of DME;Pain       PT Treatment Interventions DME instruction;Gait training;Stair training;Functional mobility training;Therapeutic activities;Therapeutic exercise;Patient/family education    PT Goals (Current goals can be found in the Care Plan section)  Acute Rehab PT Goals Patient Stated Goal: to go home PT Goal Formulation: All assessment and education complete, DC therapy    Frequency Min 1X/week   Barriers to discharge        Co-evaluation               AM-PAC PT "6 Clicks" Daily Activity  Outcome Measure Difficulty turning over in bed (including adjusting bedclothes, sheets and blankets)?: A Little Difficulty moving from lying on back to sitting on the side of the bed? : A Little Difficulty sitting down on and standing up from a chair with arms (e.g., wheelchair, bedside commode, etc,.)?: A Little Help needed moving to and from a bed to chair (including a wheelchair)?: A Little Help needed walking in hospital room?: A Little Help needed climbing 3-5 steps with a railing? : A Little 6 Click Score: 18    End of Session Equipment Utilized During Treatment: Gait belt Activity Tolerance: Patient tolerated treatment well Patient left: in chair;with call bell/phone within reach;with family/visitor present Nurse Communication: Mobility status PT Visit Diagnosis: Difficulty in walking, not elsewhere classified (R26.2)    Time: 2706-2376 PT Time Calculation (min) (ACUTE ONLY): 41 min   Charges:   PT Evaluation $PT Eval Low Complexity: 1 Low PT Treatments $Gait Training: 8-22 mins $Therapeutic Exercise: 8-22 mins   PT G Codes:        Pg [phone]   Allayna Erlich 07/18/2017, 12:19 PM

## 2017-07-18 NOTE — Op Note (Signed)
[name], Ballard NO.:  [id number]  MEDICAL RECORD NO.:  [id number]  LOCATION:                                 FACILITY:  PHYSICIAN:  Gaynelle Arabian, M.D.         DATE OF BIRTH:  DATE OF PROCEDURE:  07/17/2017 DATE OF DISCHARGE:                              OPERATIVE REPORT   PREOPERATIVE DIAGNOSIS:  Unstable left total knee arthroplasty.  POSTOPERATIVE DIAGNOSIS:  Unstable left total knee arthroplasty.  PROCEDURE:  Left knee polyethylene revision.  SURGEON:  Gaynelle Arabian, M.D.  ASSISTANT:  Alexzandrew L. Perkins, P.A.C.  ANESTHESIA:  Adductor canal block and spinal.  ESTIMATED BLOOD LOSS:  Minimal.  DRAINS:  Hemovac x1.  TOURNIQUET TIME:  26 minutes at 300 mmHg.  COMPLICATIONS:  None.  CONDITION:  Stable to recovery.  BRIEF CLINICAL NOTE:  Emily Ballard is a 56 year old female who had a left total knee arthroplasty performed few years ago.  She has had problems with stiffness in the knee, but also had significant problems with pain and instability.  She had a bone scan, which showed no loosening of her prosthesis.  Exam and history were consistent with unstable total knee. She has failed nonoperative management and presents now for left knee tibial polyethylene versus total knee revision.  PROCEDURE IN DETAIL:  After successful administration of adductor canal block, then spinal anesthetic, a tourniquet was placed high on her left thigh and left lower extremity, prepped and draped in the usual sterile fashion.  Exam under anesthesia shows that her range is about 0-95 and that she has fairly significant varus-valgus laxity in extension and AP laxity in flexion.  We then wrapped the lower extremity with an Esmarch and inflated the tourniquet to 300 mmHg.  Midline incision was made with a 10-blade through the subcutaneous tissue to the level of the extensor mechanism.  A fresh blade was used to make a medial parapatellar arthrotomy, soft tissue  on the proximal medial tibia was subperiosteally elevated to the joint line with a knife and into the semimembranosus bursa with a Cobb elevator.  Soft tissue laterally was elevated with attention being paid to avoiding the patellar tendon on the tibial tubercle.  I had to remove a significant amount of scar from under the extensor mechanism both medial and lateral to recreate the medial and lateral gutters.  I was then able to evert the patella and flex the knee 90 degrees.  I was able to sublux the tibia forward from the femur and then easily removed the tibial polyethylene.  She has a size 3 tibial polyethylene for the Attune knee.  This was 5 mm thick.  I removed the polyethylene.  The prosthetic components of the femur, tibia and patella were in excellent position and were stable.  There was no sign of any loosening.  I then trialed a tibial polyethylene up to 8 mm and with the 8, openings fairly wide medially, but was not opening laterally in flexion.  I inspected the popliteus and this was tight, and I released the popliteus off the lateral femoral condyle, which balanced her in flexion.  I was able to place a  10-mm trial insert with the 10.  She had full extension with excellent varus-valgus and anterior-posterior balance throughout full range of motion.  The knee was very stable and was very well balanced.  I felt that this would be the proper thickness polyethylene to place to give her a stable knee and allow for better range of motion.  We then removed the trial and placed the 10-mm thickness rotating platform polyethylene for the size 3 Attune tibia. The knee was reduced with outstanding stability throughout full range of motion.  The wound was copiously irrigated with saline solution and then, we placed 20 mL of Exparel mixed with 60 mL of saline into the periosteum of the femur, the extensor mechanism and the subcutaneous tissues.  The arthrotomy was then closed over Hemovac  drain with a running #1 V-Loc suture.  Tourniquet was released, total time of 26 minutes.  Minor bleeding stopped with electrocautery.  Subcu was closed with interrupted 2-0 Vicryl and subcuticular running 4-0 Monocryl.  The drain was hooked to suction.  Incision cleaned and dried, and a bulky sterile dressing applied.  She was placed into a knee immobilizer, awakened and transported to recovery in stable condition.  Note that a surgical assistant was a medical necessity for this procedure.  Assistant was necessary for retraction of ligaments and vital neurovascular structures and for proper tensioning of the knee in flexion and extension to allow for proper balance of the knee.     Gaynelle Arabian, M.D.     FA/MEDQ  D:  07/17/2017  T:  07/18/2017  Job:  [id number]

## 2017-07-22 DIAGNOSIS — M25662 Stiffness of left knee, not elsewhere classified: Secondary | ICD-10-CM | POA: Diagnosis not present

## 2017-07-24 DIAGNOSIS — M25662 Stiffness of left knee, not elsewhere classified: Secondary | ICD-10-CM | POA: Diagnosis not present

## 2017-07-26 DIAGNOSIS — M25662 Stiffness of left knee, not elsewhere classified: Secondary | ICD-10-CM | POA: Diagnosis not present

## 2017-07-28 NOTE — Discharge Summary (Signed)
Physician Discharge Summary   Patient ID: Emily Ballard MRN: [id number] DOB/AGE: 09-16-1960 56 y.o.  Admit date: 07/17/2017 Discharge date: 07/18/2017  Primary Diagnosis: Unstable left total knee arthroplasty.   Admission Diagnoses:  Past Medical History:  Diagnosis Date  . GERD (gastroesophageal reflux disease)   . Melanoma ([place])    right leg  . Pneumonia    hx of  . PONV (postoperative nausea and vomiting)   . Primary localized osteoarthritis of left knee    Discharge Diagnoses:   Principal Problem:   Failed total knee arthroplasty ([place])  Estimated body mass index is 26.56 kg/m as calculated from the following:   Height as of this encounter: 5' (1.524 m).   Weight as of this encounter: 61.7 kg (136 lb).  Procedure:  Procedure(s) (LRB): LEFT KNEE POLYETHYLENE REVISION (Left)   Consults: None  HPI: Emily Ballard is a 56 year old female who had a left total knee arthroplasty performed few years ago.  She has had problems with stiffness in the knee, but also had significant problems with pain and instability.  She had a bone scan, which showed no loosening of her prosthesis.  Exam and history were consistent with unstable total knee. She has failed nonoperative management and presents now for left knee tibial polyethylene versus total knee revision.   Laboratory Data: Admission on 07/17/2017, Discharged on 07/18/2017  Component Date Value Ref Range Status  . WBC 07/18/2017 11.8* 4.0 - 10.5 K/uL Final  . RBC 07/18/2017 4.14  3.87 - 5.11 MIL/uL Final  . Hemoglobin 07/18/2017 12.6  12.0 - 15.0 g/dL Final  . HCT 07/18/2017 37.4  36.0 - 46.0 % Final  . MCV 07/18/2017 90.3  78.0 - 100.0 fL Final  . MCH 07/18/2017 30.4  26.0 - 34.0 pg Final  . MCHC 07/18/2017 33.7  30.0 - 36.0 g/dL Final  . RDW 07/18/2017 12.3  11.5 - 15.5 % Final  . Platelets 07/18/2017 269  150 - 400 K/uL Final  . Sodium 07/18/2017 138  135 - 145 mmol/L Final  . Potassium 07/18/2017 4.1  3.5 - 5.1  mmol/L Final  . Chloride 07/18/2017 105  101 - 111 mmol/L Final  . CO2 07/18/2017 27  22 - 32 mmol/L Final  . Glucose, Bld 07/18/2017 128* 65 - 99 mg/dL Final  . BUN 07/18/2017 7  6 - 20 mg/dL Final  . Creatinine, Ser 07/18/2017 0.76  0.44 - 1.00 mg/dL Final  . Calcium 07/18/2017 8.5* 8.9 - 10.3 mg/dL Final  . GFR calc non Af Amer 07/18/2017 >60  >60 mL/min Final  . GFR calc Af Amer 07/18/2017 >60  >60 mL/min Final   Comment: (NOTE) The eGFR has been calculated using the CKD EPI equation. This calculation has not been validated in all clinical situations. eGFR's persistently <60 mL/min signify possible Chronic Kidney Disease.   Georgiann Hahn gap 07/18/2017 6  5 - 15 Final  Hospital Outpatient Visit on 07/08/2017  Component Date Value Ref Range Status  . aPTT 07/08/2017 28  24 - 36 seconds Final  . WBC 07/08/2017 5.2  4.0 - 10.5 K/uL Final  . RBC 07/08/2017 4.56  3.87 - 5.11 MIL/uL Final  . Hemoglobin 07/08/2017 14.0  12.0 - 15.0 g/dL Final  . HCT 07/08/2017 41.1  36.0 - 46.0 % Final  . MCV 07/08/2017 90.1  78.0 - 100.0 fL Final  . MCH 07/08/2017 30.7  26.0 - 34.0 pg Final  . MCHC 07/08/2017 34.1  30.0 - 36.0 g/dL Final  .  RDW 07/08/2017 12.0  11.5 - 15.5 % Final  . Platelets 07/08/2017 243  150 - 400 K/uL Final  . Sodium 07/08/2017 137  135 - 145 mmol/L Final  . Potassium 07/08/2017 4.5  3.5 - 5.1 mmol/L Final  . Chloride 07/08/2017 105  101 - 111 mmol/L Final  . CO2 07/08/2017 27  22 - 32 mmol/L Final  . Glucose, Bld 07/08/2017 108* 65 - 99 mg/dL Final  . BUN 07/08/2017 15  6 - 20 mg/dL Final  . Creatinine, Ser 07/08/2017 0.98  0.44 - 1.00 mg/dL Final  . Calcium 07/08/2017 9.1  8.9 - 10.3 mg/dL Final  . Total Protein 07/08/2017 6.9  6.5 - 8.1 g/dL Final  . Albumin 07/08/2017 4.0  3.5 - 5.0 g/dL Final  . AST 07/08/2017 29  15 - 41 U/L Final  . ALT 07/08/2017 20  14 - 54 U/L Final  . Alkaline Phosphatase 07/08/2017 42  38 - 126 U/L Final  . Total Bilirubin 07/08/2017 0.5  0.3 - 1.2  mg/dL Final  . GFR calc non Af Amer 07/08/2017 >60  >60 mL/min Final  . GFR calc Af Amer 07/08/2017 >60  >60 mL/min Final   Comment: (NOTE) The eGFR has been calculated using the CKD EPI equation. This calculation has not been validated in all clinical situations. eGFR's persistently <60 mL/min signify possible Chronic Kidney Disease.   . Anion gap 07/08/2017 5  5 - 15 Final  . Prothrombin Time 07/08/2017 12.4  11.4 - 15.2 seconds Final  . INR 07/08/2017 0.93   Final  . ABO/RH(D) 07/08/2017 O POS   Final  . Antibody Screen 07/08/2017 NEG   Final  . Sample Expiration 07/08/2017 07/20/2017   Final  . Extend sample reason 07/08/2017 NO TRANSFUSIONS OR PREGNANCY IN THE PAST 3 MONTHS   Final  . MRSA, PCR 07/08/2017 NEGATIVE  NEGATIVE Final  . Staphylococcus aureus 07/08/2017 NEGATIVE  NEGATIVE Final   Comment: (NOTE) The Xpert SA Assay (FDA approved for NASAL specimens in patients 59 years of age and older), is one component of a comprehensive surveillance program. It is not intended to diagnose infection nor to guide or monitor treatment.   . ABO/RH(D) 07/08/2017 O POS   Final     X-Rays:No results found.  EKG: Orders placed or performed during the hospital encounter of 07/08/17  . EKG 12 lead  . EKG 12 lead     Hospital Course: Emily Ballard is a 56 y.o. who was admitted to [facility]. They were brought to the operating room on 07/17/2017 and underwent Procedure(s): [place].  Patient tolerated the procedure well and was later transferred to the recovery room and then to the orthopaedic floor for postoperative care.  They were given PO and IV analgesics for pain control following their surgery.  They were given 24 hours of postoperative antibiotics of  Anti-infectives (From admission, onward)   Start     Dose/Rate Route Frequency Ordered Stop   07/17/17 1900  ceFAZolin (ANCEF) IVPB 2g/100 mL premix     2 g 200 mL/hr over 30 Minutes Intravenous  Every 6 hours 07/17/17 1626 07/18/17 0025   07/17/17 1045  ceFAZolin (ANCEF) 2-4 GM/100ML-% IVPB    Comments:  Bridget Hartshorn   : cabinet override      07/17/17 1045 07/17/17 1250   07/17/17 1038  ceFAZolin (ANCEF) IVPB 2g/100 mL premix     2 g 200 mL/hr over 30 Minutes Intravenous On call  to O.R. 07/17/17 1038 07/17/17 1250     and started on DVT prophylaxis in the form of Xarelto.   PT and OT were ordered for postop therapy protocol.  Discharge planning consulted to help with postop disposition and equipment needs.  Patient had a good night on the evening of surgery.  They started to get up OOB with therapy on day one. Hemovac drain was pulled without difficulty.  Patient was seen in rounds on day one and it was felt that as long as they did well with the remaining sessions of therapy that they would be ready to go home.  Arrangements were made and they were setup to go home on POD 1.  Diet - Cardiac diet Follow up - in 2 weeks Activity - WBAT Dressing - May remove the surgical dressing tomorrow at home and then apply a dry gauze dressing daily. May shower three days following surgery but do not submerge the incision under water. Disposition - Home Condition Upon Discharge - Stable D/C Meds - See DC Summary   Allergies as of 07/18/2017      Reactions   Codeine Nausea Only   Latex Other (See Comments)   Causes burn   Shellfish Allergy Diarrhea, Nausea And Vomiting   Sulfamethoxazole Other (See Comments)   GI Upset (intolerance)      Medication List    STOP taking these medications   CALCIUM PO   naproxen sodium 220 MG tablet Commonly known as:  ALEVE   PREMARIN 0.625 MG tablet Generic drug:  estrogens (conjugated)   VITAMIN B-12 PO   VITAMIN D3 PO     TAKE these medications   acetaminophen 325 MG tablet Commonly known as:  TYLENOL Take 2 tablets (650 mg total) by mouth every 6 (six) hours as needed for mild pain (or Fever >/= 101).   ALPRAZolam 0.5 MG  tablet Commonly known as:  XANAX Take 0.5 mg by mouth at bedtime as needed for anxiety.   cephALEXin 500 MG capsule Commonly known as:  KEFLEX Take 500 mg by mouth daily.   cycloSPORINE 0.05 % ophthalmic emulsion Commonly known as:  RESTASIS Place 1 drop into both eyes 2 (two) times daily.   docusate sodium 100 MG capsule Commonly known as:  COLACE 1 tab 2 times a day while on narcotics.  STOOL SOFTENER   fluticasone 50 MCG/ACT nasal spray Commonly known as:  FLONASE Place 1 spray into both nostrils daily as needed for allergies or rhinitis.   loratadine 10 MG tablet Commonly known as:  CLARITIN Take 10 mg by mouth daily.   Magnesium 250 MG Tabs Take 250 mg by mouth daily.   Melatonin 10 MG Tabs Take 10 mg by mouth at bedtime as needed.   methocarbamol 500 MG tablet Commonly known as:  ROBAXIN Take 1 tablet (500 mg total) by mouth every 6 (six) hours as needed for muscle spasms.   METRONIDAZOLE (TOPICAL) 0.75 % Lotn Apply 1 application topically daily.   oxyCODONE 5 MG immediate release tablet Commonly known as:  Oxy IR/ROXICODONE Take 1-2 tablets (5-10 mg total) by mouth every 3 (three) hours as needed for moderate pain or severe pain ((score 4 to 6)).   PROBIOTIC PO Take 1 capsule by mouth 2 (two) times daily.   rivaroxaban 10 MG Tabs tablet Commonly known as:  XARELTO Take 1 tablet (10 mg total) by mouth daily with breakfast.   traMADol 50 MG tablet Commonly known as:  ULTRAM Take 1-2 tablets (50-100  mg total) by mouth every 6 (six) hours as needed for moderate pain.   traMADol 50 MG tablet Commonly known as:  ULTRAM Take 1-2 tablets (50-100 mg total) by mouth every 6 (six) hours as needed for moderate pain (if refractory to oxycodone 5 mg).   traZODone 50 MG tablet Commonly known as:  DESYREL Take 50 mg by mouth at bedtime.   tretinoin 0.025 % cream Commonly known as:  RETIN-A Apply 1 application topically at bedtime.      Follow-up Information     Gaynelle Arabian, MD. Schedule an appointment as soon as possible for a visit on 07/30/2017.   Specialty:  Orthopedic Surgery Why:  Call [phone] Monday to make the appointment Contact information: [address] [place] [address] [phone]           Signed: Arlee Muslim, PA-C Orthopaedic Surgery 07/28/2017, 5:21 PM

## 2017-07-29 DIAGNOSIS — M25662 Stiffness of left knee, not elsewhere classified: Secondary | ICD-10-CM | POA: Diagnosis not present

## 2017-07-31 DIAGNOSIS — M25662 Stiffness of left knee, not elsewhere classified: Secondary | ICD-10-CM | POA: Diagnosis not present

## 2017-08-02 DIAGNOSIS — M25662 Stiffness of left knee, not elsewhere classified: Secondary | ICD-10-CM | POA: Diagnosis not present

## 2017-08-07 DIAGNOSIS — M25662 Stiffness of left knee, not elsewhere classified: Secondary | ICD-10-CM | POA: Diagnosis not present

## 2017-08-12 DIAGNOSIS — M25662 Stiffness of left knee, not elsewhere classified: Secondary | ICD-10-CM | POA: Diagnosis not present

## 2017-08-14 DIAGNOSIS — M25662 Stiffness of left knee, not elsewhere classified: Secondary | ICD-10-CM | POA: Diagnosis not present

## 2017-08-16 DIAGNOSIS — M25662 Stiffness of left knee, not elsewhere classified: Secondary | ICD-10-CM | POA: Diagnosis not present

## 2017-08-26 DIAGNOSIS — M25662 Stiffness of left knee, not elsewhere classified: Secondary | ICD-10-CM | POA: Diagnosis not present

## 2017-08-29 DIAGNOSIS — M25662 Stiffness of left knee, not elsewhere classified: Secondary | ICD-10-CM | POA: Insufficient documentation

## 2017-08-30 DIAGNOSIS — Z471 Aftercare following joint replacement surgery: Secondary | ICD-10-CM | POA: Diagnosis not present

## 2017-08-30 DIAGNOSIS — Z96659 Presence of unspecified artificial knee joint: Secondary | ICD-10-CM | POA: Insufficient documentation

## 2017-08-30 DIAGNOSIS — Z96652 Presence of left artificial knee joint: Secondary | ICD-10-CM | POA: Diagnosis not present

## 2017-09-03 DIAGNOSIS — M25662 Stiffness of left knee, not elsewhere classified: Secondary | ICD-10-CM | POA: Diagnosis not present

## 2017-09-05 DIAGNOSIS — M25662 Stiffness of left knee, not elsewhere classified: Secondary | ICD-10-CM | POA: Diagnosis not present

## 2017-10-31 DIAGNOSIS — L570 Actinic keratosis: Secondary | ICD-10-CM | POA: Diagnosis not present

## 2017-10-31 DIAGNOSIS — Z85828 Personal history of other malignant neoplasm of skin: Secondary | ICD-10-CM | POA: Diagnosis not present

## 2017-10-31 DIAGNOSIS — L814 Other melanin hyperpigmentation: Secondary | ICD-10-CM | POA: Diagnosis not present

## 2017-10-31 DIAGNOSIS — Z85820 Personal history of malignant melanoma of skin: Secondary | ICD-10-CM | POA: Diagnosis not present

## 2017-11-28 ENCOUNTER — Ambulatory Visit (INDEPENDENT_AMBULATORY_CARE_PROVIDER_SITE_OTHER): Payer: 59 | Admitting: Sports Medicine

## 2017-11-28 ENCOUNTER — Encounter: Payer: Self-pay | Admitting: Sports Medicine

## 2017-11-28 DIAGNOSIS — M7741 Metatarsalgia, right foot: Secondary | ICD-10-CM

## 2017-11-28 DIAGNOSIS — M7742 Metatarsalgia, left foot: Secondary | ICD-10-CM

## 2017-11-28 NOTE — Progress Notes (Signed)
   HPI  CC: Metatarsalgia follow-up Patient is here to follow-up regarding her metatarsalgia.  She was treated in the past with custom orthotics and states that she has been doing very well on these.  She is here today because she would like a new pair as her old pair have begun to wear out.  She denies any new injury, trauma, swelling, or recurrent pain.  She ultimately feels very well.  No weakness, numbness, or paresthesias.  Patient is able to be as active as she would like to be, and is only getting better with time as she is recovering from a relatively recent knee surgery.  Medications/Interventions Tried: orthotics w/ MT pad  Past Hx Revision of TKR 4 mos ago by Dr Maureen Ralphs Doing well and no longer painful  Ros CAn walk up to 2 miles Full extension of RT knee Flexion to 120 before any pain  See HPI and/or previous note for associated ROS.  Objective: BP 122/80   Ht 5' (1.524 m)   Wt 135 lb (61.2 kg)   BMI 26.37 kg/m  Gen: NAD, well groomed, a/o x3, normal affect.  CV: Well-perfused. Warm.  Resp: Non-labored.  Neuro: Sensation intact throughout. No gross coordination deficits.  Gait: Nonpathologic posture, unremarkable stride without signs of limp or balance issues. Ankle/Foot, bilateral: No TTP noted at this time. No visible erythema, swelling, ecchymosis, or bony deformity. Notable mild to moderate pes planus deformity (L>R); Mild tibiotalar valgus deviation on the left side; Range of motion is full in all directions. Strength is 5/5 in all directions. No peroneal tendon tenderness or subluxation; No tenderness on posterior aspects of lateral and medial malleolus; Stable lateral and medial ligaments; No plantar calcaneal tenderness; No current tenderness at the distal metatarsals; Able to walk 4 steps.   Custom Orthotics: - Patient was fitted for a standard, cushioned, semi-rigid orthotic. - Orthotic was heated and afterward the patient stood on the orthotic blank positioned  on the orthotic stand. - The patient was positioned in subtalar neutral position and 10 degrees of ankle dorsiflexion in a weight bearing stance. - After completion of molding, a stable base was applied to the orthotic blank. - The blank was ground to a stable position for weight bearing. - Size: 6 - Base: Blue EVA - Additional Posting and Padding: Small MT Pad (bilateral) - The patient ambulated in these, and they were very comfortable.   Assessment and plan:  Metatarsalgia of both feet Patient is here to follow-up regarding her metatarsalgia.  She would like a new pair of orthotics today as her prior pair has worn out and had provided her a significant amount of symptomatic relief. -Orthotics made today.  Patient ambulated in these and endorsed comfort. -Of note: Left orthotic provided some additional arch support which patient noted an obvious difference.  Would consider reducing this if patient decides orthotics are not comfortable. -Follow-up as needed   I spent 30 minutes with this patient. Over 50% of visit was spent in counseling and coordination of care for problems with metatarsalgia, bilateral.  Elberta Leatherwood, MD,MS [facility] Fellow 11/28/2017 6:54 PM  I observed and examined the patient with the [facility] fellow and agree with assessment and plan.  Note reviewed and modified by me. Stefanie Libel, MD

## 2017-11-28 NOTE — Assessment & Plan Note (Addendum)
Patient is here to follow-up regarding her metatarsalgia.  She would like a new pair of orthotics today as her prior pair has worn out and had provided her a significant amount of symptomatic relief. -Orthotics made today.  Patient ambulated in these and endorsed comfort. -Of note: Left orthotic provided some additional arch support which patient noted an obvious difference.  Would consider reducing this if patient decides orthotics are not comfortable. -Follow-up as needed

## 2018-01-30 DIAGNOSIS — Z01419 Encounter for gynecological examination (general) (routine) without abnormal findings: Secondary | ICD-10-CM | POA: Diagnosis not present

## 2018-02-03 ENCOUNTER — Other Ambulatory Visit: Payer: Self-pay | Admitting: Obstetrics & Gynecology

## 2018-02-03 DIAGNOSIS — R928 Other abnormal and inconclusive findings on diagnostic imaging of breast: Secondary | ICD-10-CM

## 2018-02-05 ENCOUNTER — Ambulatory Visit
Admission: RE | Admit: 2018-02-05 | Discharge: 2018-02-05 | Disposition: A | Discharge status: Home / Self Care | Payer: 59 | Source: Ambulatory Visit | Attending: Obstetrics & Gynecology | Admitting: Obstetrics & Gynecology

## 2018-02-05 DIAGNOSIS — N6489 Other specified disorders of breast: Secondary | ICD-10-CM | POA: Diagnosis not present

## 2018-02-05 DIAGNOSIS — R928 Other abnormal and inconclusive findings on diagnostic imaging of breast: Secondary | ICD-10-CM

## 2018-02-05 DIAGNOSIS — R922 Inconclusive mammogram: Secondary | ICD-10-CM | POA: Diagnosis not present

## 2018-02-10 DIAGNOSIS — Z85820 Personal history of malignant melanoma of skin: Secondary | ICD-10-CM | POA: Diagnosis not present

## 2018-02-10 DIAGNOSIS — Z8042 Family history of malignant neoplasm of prostate: Secondary | ICD-10-CM | POA: Diagnosis not present

## 2018-02-10 DIAGNOSIS — Z8052 Family history of malignant neoplasm of bladder: Secondary | ICD-10-CM | POA: Diagnosis not present

## 2018-03-21 DIAGNOSIS — M170 Bilateral primary osteoarthritis of knee: Secondary | ICD-10-CM | POA: Diagnosis not present

## 2018-04-02 DIAGNOSIS — Z809 Family history of malignant neoplasm, unspecified: Secondary | ICD-10-CM | POA: Diagnosis not present

## 2018-05-21 DIAGNOSIS — Z23 Encounter for immunization: Secondary | ICD-10-CM | POA: Diagnosis not present

## 2018-05-26 DIAGNOSIS — Z13220 Encounter for screening for lipoid disorders: Secondary | ICD-10-CM | POA: Diagnosis not present

## 2018-05-26 DIAGNOSIS — Z131 Encounter for screening for diabetes mellitus: Secondary | ICD-10-CM | POA: Diagnosis not present

## 2018-05-30 DIAGNOSIS — Z85820 Personal history of malignant melanoma of skin: Secondary | ICD-10-CM | POA: Diagnosis not present

## 2018-05-30 DIAGNOSIS — Z13220 Encounter for screening for lipoid disorders: Secondary | ICD-10-CM | POA: Diagnosis not present

## 2018-05-30 DIAGNOSIS — G479 Sleep disorder, unspecified: Secondary | ICD-10-CM | POA: Diagnosis not present

## 2018-08-08 DIAGNOSIS — Z96652 Presence of left artificial knee joint: Secondary | ICD-10-CM | POA: Diagnosis not present

## 2018-08-08 DIAGNOSIS — Z471 Aftercare following joint replacement surgery: Secondary | ICD-10-CM | POA: Diagnosis not present

## 2018-10-17 DIAGNOSIS — K136 Irritative hyperplasia of oral mucosa: Secondary | ICD-10-CM | POA: Diagnosis not present

## 2018-11-03 DIAGNOSIS — L821 Other seborrheic keratosis: Secondary | ICD-10-CM | POA: Diagnosis not present

## 2018-11-03 DIAGNOSIS — L570 Actinic keratosis: Secondary | ICD-10-CM | POA: Diagnosis not present

## 2018-11-03 DIAGNOSIS — Z85828 Personal history of other malignant neoplasm of skin: Secondary | ICD-10-CM | POA: Diagnosis not present

## 2018-11-03 DIAGNOSIS — D485 Neoplasm of uncertain behavior of skin: Secondary | ICD-10-CM | POA: Diagnosis not present

## 2018-11-03 DIAGNOSIS — L430 Hypertrophic lichen planus: Secondary | ICD-10-CM | POA: Diagnosis not present

## 2018-11-03 DIAGNOSIS — Z85820 Personal history of malignant melanoma of skin: Secondary | ICD-10-CM | POA: Diagnosis not present

## 2018-12-30 ENCOUNTER — Ambulatory Visit (INDEPENDENT_AMBULATORY_CARE_PROVIDER_SITE_OTHER): Payer: 59

## 2018-12-30 ENCOUNTER — Other Ambulatory Visit: Payer: Self-pay

## 2018-12-30 ENCOUNTER — Ambulatory Visit (INDEPENDENT_AMBULATORY_CARE_PROVIDER_SITE_OTHER): Payer: 59 | Admitting: Podiatry

## 2018-12-30 ENCOUNTER — Encounter: Payer: Self-pay | Admitting: Podiatry

## 2018-12-30 VITALS — Temp 97.5°F

## 2018-12-30 DIAGNOSIS — M2012 Hallux valgus (acquired), left foot: Secondary | ICD-10-CM

## 2018-12-30 DIAGNOSIS — M2042 Other hammer toe(s) (acquired), left foot: Secondary | ICD-10-CM

## 2018-12-30 DIAGNOSIS — M2041 Other hammer toe(s) (acquired), right foot: Secondary | ICD-10-CM

## 2018-12-31 ENCOUNTER — Encounter: Payer: Self-pay | Admitting: Podiatry

## 2018-12-31 NOTE — Progress Notes (Signed)
Subjective:  Patient ID: Emily Ballard, female    DOB: 1960-10-12,  MRN: [id number] HPI Chief Complaint  Patient presents with  . Foot Pain    Patient concerned she is having muscle pain around knee due to structure deformities in the left foot- no current pain in the foot, very active lifestyle with marathons  . New Patient (Initial Visit)    Est pt 2016    58 y.o. female presents with the above complaint.   ROS: Denies fever chills nausea vomiting muscle aches pains calf pain back pain chest pain shortness of breath.  Past Medical History:  Diagnosis Date  . GERD (gastroesophageal reflux disease)   . Melanoma ([place])    right leg  . Pneumonia    hx of  . PONV (postoperative nausea and vomiting)   . Primary localized osteoarthritis of left knee    Past Surgical History:  Procedure Laterality Date  . ABDOMINAL HYSTERECTOMY    . CARPAL TUNNEL RELEASE Bilateral   . CESAREAN SECTION    . COLONOSCOPY    . ENDOMETRIAL ABLATION    . MEDIAL COLLATERAL LIGAMENT AND LATERAL COLLATERAL LIGAMENT REPAIR, KNEE Left   . MOUTH SURGERY    . TONSILLECTOMY    . TOTAL KNEE ARTHROPLASTY Left 06/20/2015   Procedure: TOTAL KNEE ARTHROPLASTY;  Surgeon: Elsie Saas, MD;  Location: [facility];  Service: Orthopedics;  Laterality: Left;  . TOTAL KNEE ARTHROPLASTY WITH REVISION COMPONENTS Left 07/17/2017   Procedure: LEFT KNEE POLYETHYLENE REVISION;  Surgeon: Gaynelle Arabian, MD;  Location: [facility];  Service: Orthopedics;  Laterality: Left;  . TUMOR REMOVAL     throat  . WISDOM TOOTH EXTRACTION      Current Outpatient Medications:  .  Calcium Carbonate-Vit D-Min (CALCIUM 1200 PO), Take by mouth., Disp: , Rfl:  .  VITAMIN D PO, Take by mouth., Disp: , Rfl:  .  cephALEXin (KEFLEX) 500 MG capsule, Take 500 mg by mouth daily. , Disp: , Rfl:  .  cycloSPORINE (RESTASIS) 0.05 % ophthalmic emulsion, Place 1 drop into both eyes 2 (two) times daily., Disp: , Rfl:  .  estrogens, conjugated, (PREMARIN) 0.625 MG  tablet, Premarin 0.625 mg tablet, Disp: , Rfl:  .  fluticasone (FLONASE) 50 MCG/ACT nasal spray, Place 1 spray into both nostrils daily as needed for allergies or rhinitis., Disp: , Rfl:  .  loratadine (CLARITIN) 10 MG tablet, Take 10 mg by mouth daily., Disp: , Rfl:  .  METRONIDAZOLE, TOPICAL, 0.75 % LOTN, Apply 1 application topically daily., Disp: , Rfl:  .  traZODone (DESYREL) 50 MG tablet, Take 50 mg by mouth at bedtime. , Disp: , Rfl:  .  tretinoin (RETIN-A) 0.025 % cream, Apply 1 application topically at bedtime., Disp: , Rfl:   Allergies  Allergen Reactions  . Codeine Nausea Only  . Latex Other (See Comments)    Causes burn  . Shellfish Allergy Diarrhea and Nausea And Vomiting  . Sulfamethoxazole Other (See Comments)    GI Upset (intolerance)   Review of Systems Objective:   Vitals:   12/30/18 1030  Temp: (!) 97.5 F (36.4 C)    General: Well developed, nourished, in no acute distress, alert and oriented x3   Dermatological: Skin is warm, dry and supple bilateral. Nails x 10 are well maintained; remaining integument appears unremarkable at this time. There are no open sores, no preulcerative lesions, no rash or signs of infection present.  Vascular: Dorsalis Pedis artery and Posterior Tibial artery pedal pulses are  2/4 bilateral with immedate capillary fill time. Pedal hair growth present. No varicosities and no lower extremity edema present bilateral.   Neruologic: Grossly intact via light touch bilateral. Vibratory intact via tuning fork bilateral. Protective threshold with Semmes Wienstein monofilament intact to all pedal sites bilateral. Patellar and Achilles deep tendon reflexes 2+ bilateral. No Babinski or clonus noted bilateral.   Musculoskeletal: No gross boney pedal deformities bilateral. No pain, crepitus, or limitation noted with foot and ankle range of motion bilateral. Muscular strength 5/5 in all groups tested bilateral.  Gait: Unassisted, Nonantalgic.     Radiographs:  Radiographs taken today do not demonstrate any acute findings rectus foot type no osseous abnormalities.  Assessment & Plan:   Assessment: Rectus foot type normal range of motion of all the joints mild hallux valgus mild hammertoe deformity second left otherwise nothing contributory that would affect the knee.  Plan: Follow-up with her knee doctor.  We did get her into a pair of dress orthotics.     Emily Ballard T. [place], [place]

## 2019-01-27 ENCOUNTER — Ambulatory Visit (INDEPENDENT_AMBULATORY_CARE_PROVIDER_SITE_OTHER): Payer: 59 | Admitting: Orthotics

## 2019-01-27 ENCOUNTER — Other Ambulatory Visit: Payer: Self-pay

## 2019-01-27 DIAGNOSIS — M2042 Other hammer toe(s) (acquired), left foot: Secondary | ICD-10-CM

## 2019-01-27 DIAGNOSIS — M2012 Hallux valgus (acquired), left foot: Secondary | ICD-10-CM

## 2019-01-27 NOTE — Progress Notes (Signed)
Patient came in today to pick up custom made foot orthotics.  The goals were accomplished and the patient reported no dissatisfaction with said orthotics.  Patient was advised of breakin period and how to report any issues. 

## 2020-04-15 ENCOUNTER — Other Ambulatory Visit: Payer: 59

## 2020-04-15 ENCOUNTER — Other Ambulatory Visit: Payer: Self-pay

## 2020-04-15 DIAGNOSIS — Z20822 Contact with and (suspected) exposure to covid-19: Secondary | ICD-10-CM

## 2020-04-16 LAB — SARS-COV-2, NAA 2 DAY TAT

## 2020-04-16 LAB — NOVEL CORONAVIRUS, NAA: SARS-CoV-2, NAA: NOT DETECTED

## 2021-05-02 ENCOUNTER — Other Ambulatory Visit: Payer: Self-pay | Admitting: Obstetrics & Gynecology

## 2021-05-02 DIAGNOSIS — R928 Other abnormal and inconclusive findings on diagnostic imaging of breast: Secondary | ICD-10-CM

## 2021-05-04 ENCOUNTER — Ambulatory Visit
Admission: RE | Admit: 2021-05-04 | Discharge: 2021-05-04 | Disposition: A | Discharge status: Home / Self Care | Payer: 59 | Source: Ambulatory Visit | Attending: Obstetrics & Gynecology | Admitting: Obstetrics & Gynecology

## 2021-05-04 ENCOUNTER — Other Ambulatory Visit: Payer: Self-pay

## 2021-05-04 DIAGNOSIS — R928 Other abnormal and inconclusive findings on diagnostic imaging of breast: Secondary | ICD-10-CM

## 2021-09-04 ENCOUNTER — Other Ambulatory Visit: Payer: Self-pay | Admitting: Family Medicine

## 2021-09-04 DIAGNOSIS — Z136 Encounter for screening for cardiovascular disorders: Secondary | ICD-10-CM

## 2021-09-04 DIAGNOSIS — Z87891 Personal history of nicotine dependence: Secondary | ICD-10-CM

## 2021-09-28 ENCOUNTER — Ambulatory Visit
Admission: RE | Admit: 2021-09-28 | Discharge: 2021-09-28 | Disposition: A | Discharge status: Home / Self Care | Payer: No Typology Code available for payment source | Source: Ambulatory Visit | Attending: Family Medicine | Admitting: Family Medicine

## 2021-09-28 DIAGNOSIS — Z87891 Personal history of nicotine dependence: Secondary | ICD-10-CM

## 2021-09-28 DIAGNOSIS — Z136 Encounter for screening for cardiovascular disorders: Secondary | ICD-10-CM

## 2023-05-16 IMAGING — MG DIGITAL DIAGNOSTIC UNILAT LEFT W/ CAD
3 series · 3 of 3 positions shown · non-contrast
Comparison: Previous exam(s).

CLINICAL DATA: Calcifications in the medial left breast on a recent
screening mammogram.

EXAM:
DIGITAL DIAGNOSTIC UNILATERAL LEFT MAMMOGRAM WITH CAD
TECHNIQUE: Left digital diagnostic mammography was performed. Mammographic
images were processed with CAD.

[L ML (1 of 2)]
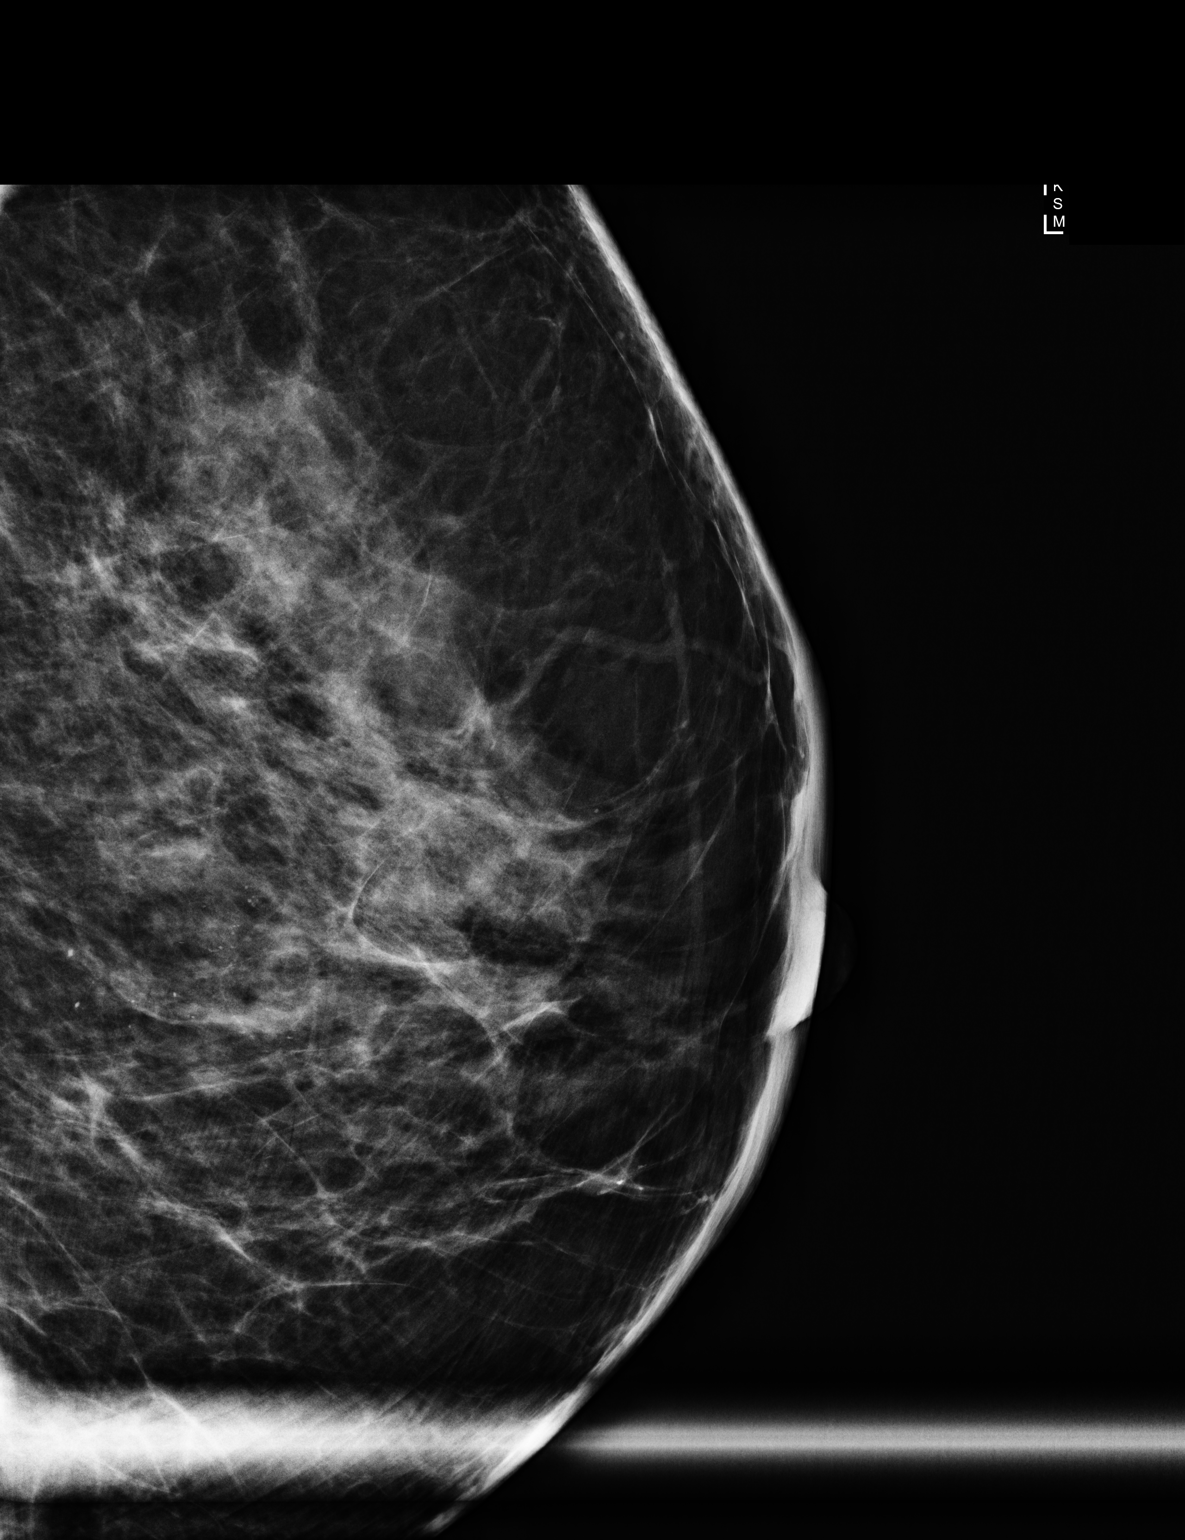

[L ML (2 of 2)]
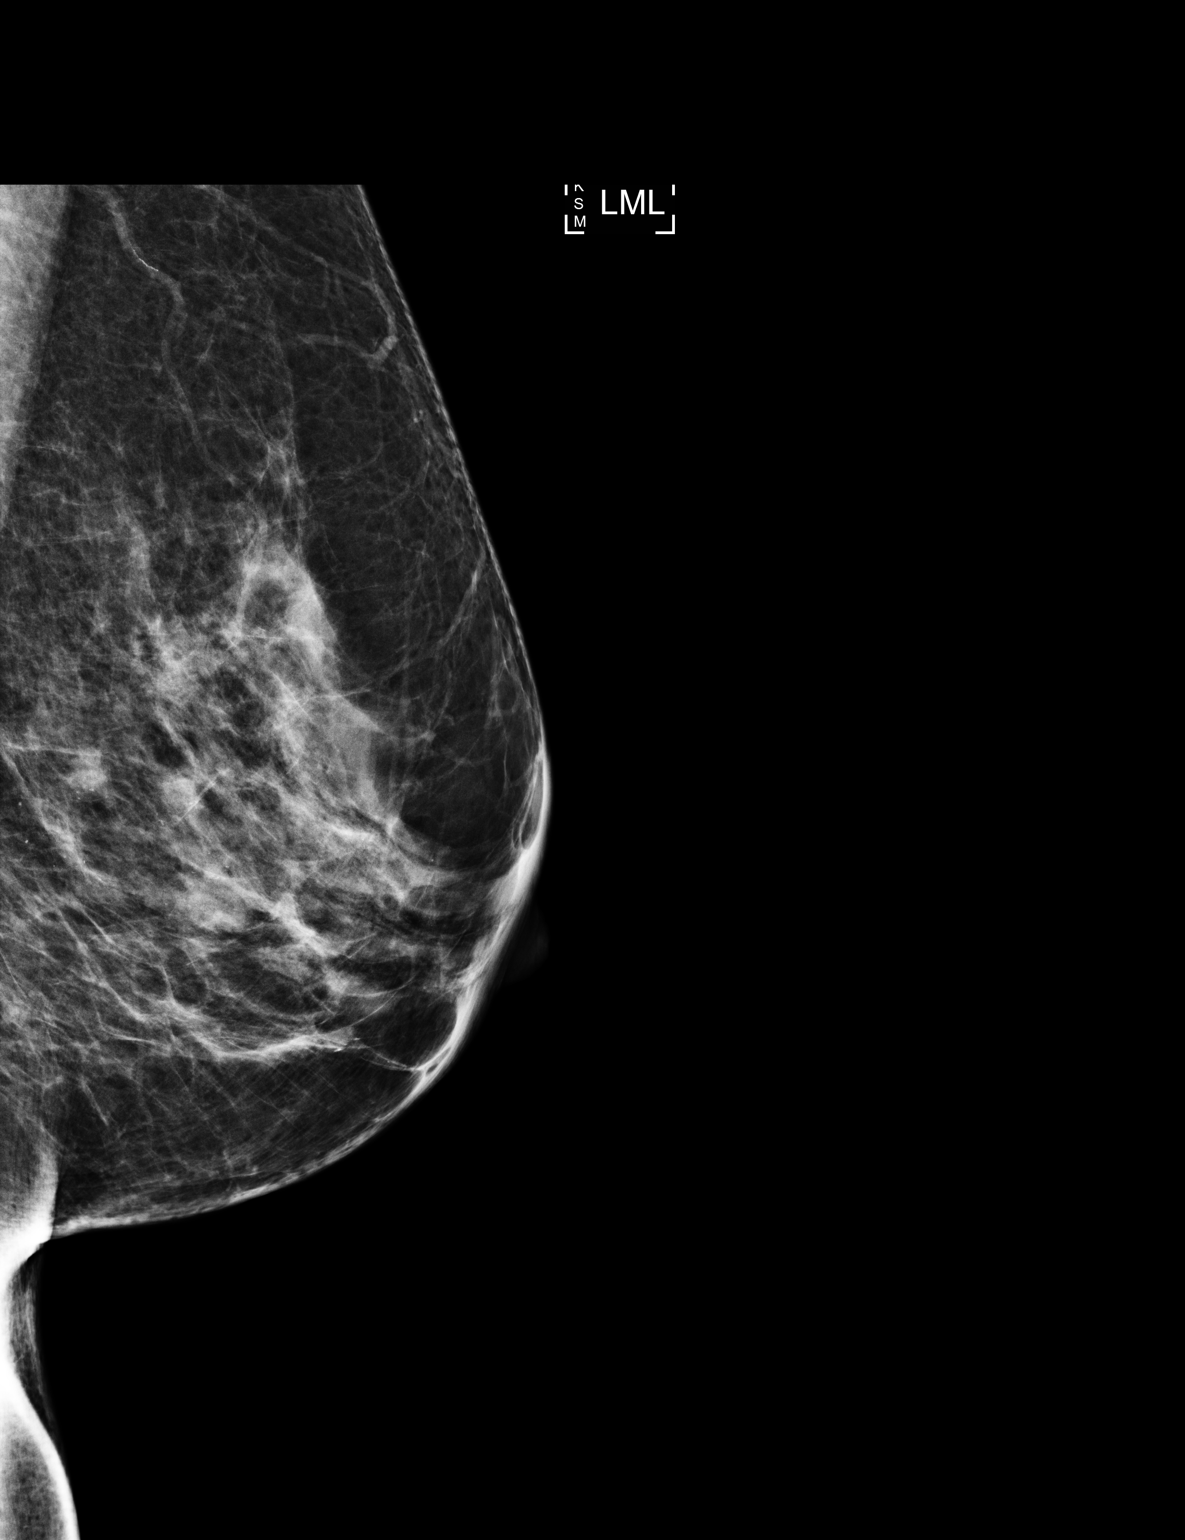

[L CC]
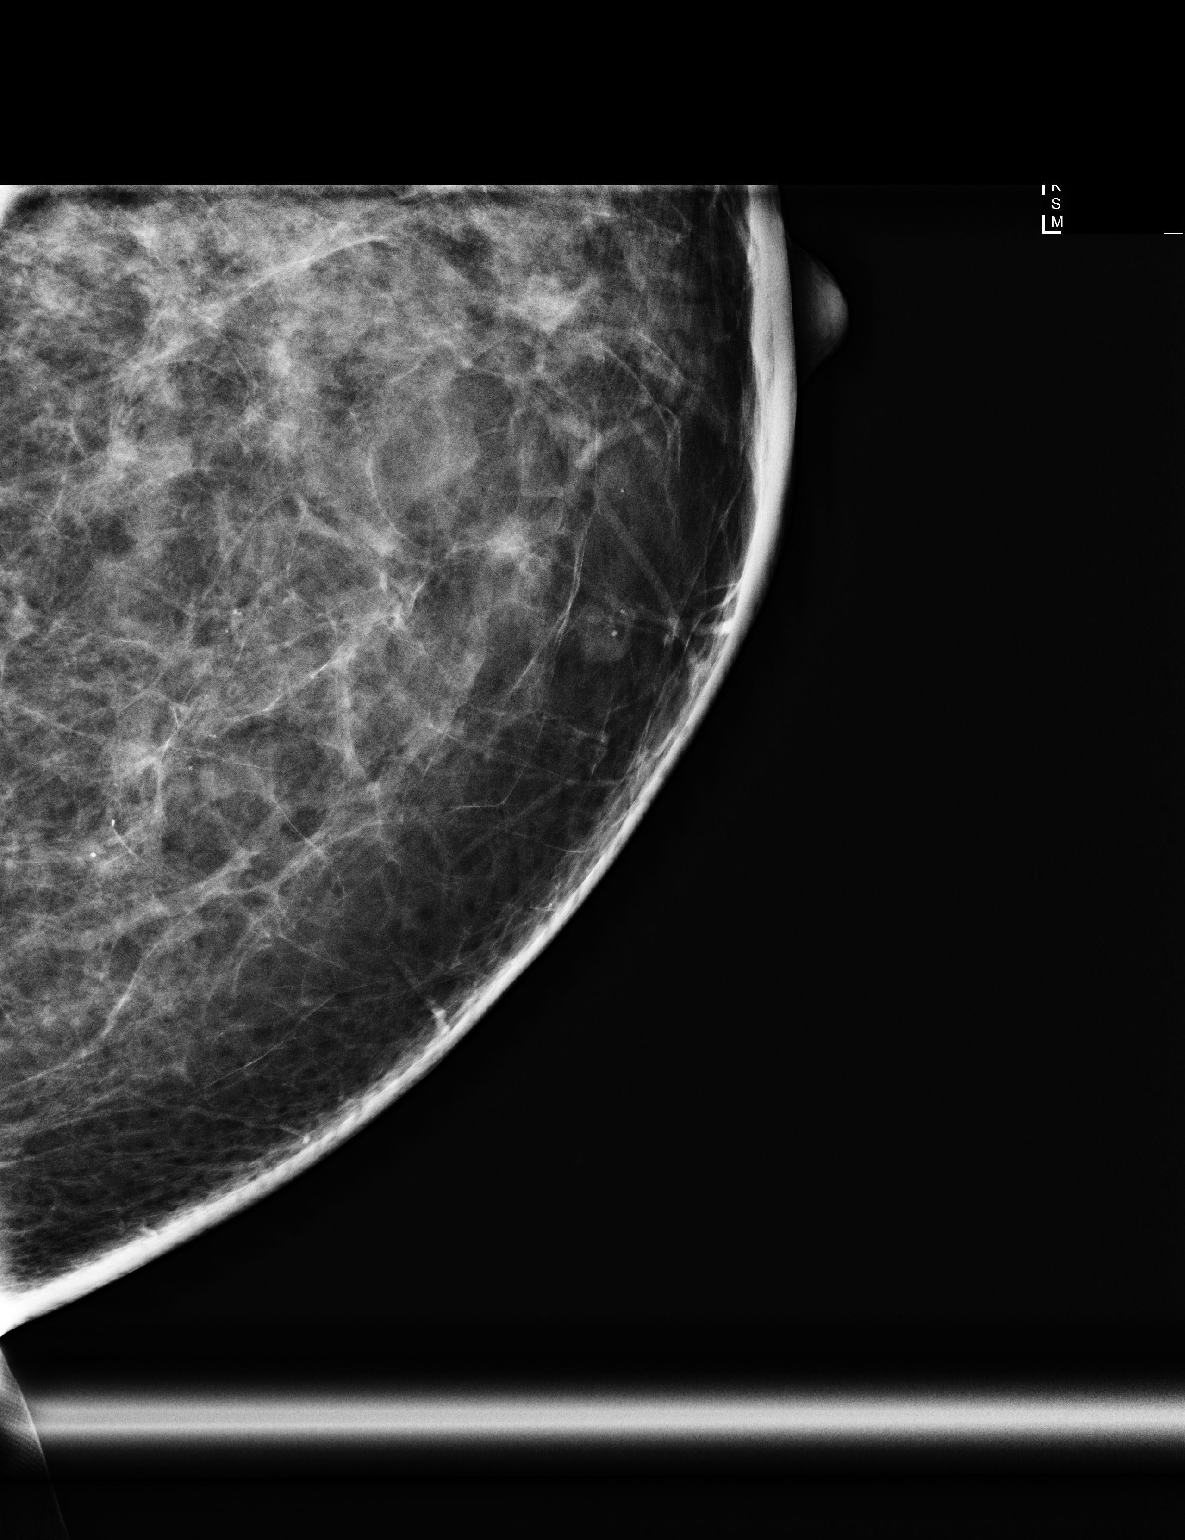

[3 of 3 positions shown; findings below may reference images not displayed]

ACR Breast Density Category c: The breast tissue is heterogeneously
dense, which may obscure small masses.
FINDINGS: There are multiple loosely grouped calcifications in medial left
breast. In the craniocaudal projection, these are small and oval in
shape. These have a similar shape in the true lateral projection
with the exception of some that demonstrate dependent layering.
These have not changed compared to previous mammograms dating back
to 8733.
IMPRESSION: Mammographically stable benign left breast calcifications, including
milk of calcium.

RECOMMENDATION:
Bilateral screening mammogram in 1 year when due.

I have discussed the findings and recommendations with the patient.
If applicable, a reminder letter will be sent to the patient
regarding the next appointment.

BI-RADS CATEGORY  2: Benign.

## 2024-02-05 DIAGNOSIS — Z0000 Encounter for general adult medical examination without abnormal findings: Secondary | ICD-10-CM | POA: Diagnosis not present

## 2024-02-05 DIAGNOSIS — R9431 Abnormal electrocardiogram [ECG] [EKG]: Secondary | ICD-10-CM | POA: Diagnosis not present

## 2024-02-05 DIAGNOSIS — R400 Somnolence: Secondary | ICD-10-CM | POA: Diagnosis not present

## 2024-02-05 DIAGNOSIS — G479 Sleep disorder, unspecified: Secondary | ICD-10-CM | POA: Diagnosis not present

## 2024-02-05 DIAGNOSIS — Z23 Encounter for immunization: Secondary | ICD-10-CM | POA: Diagnosis not present

## 2024-02-19 DIAGNOSIS — R9431 Abnormal electrocardiogram [ECG] [EKG]: Secondary | ICD-10-CM | POA: Diagnosis not present

## 2024-02-21 DIAGNOSIS — G4719 Other hypersomnia: Secondary | ICD-10-CM | POA: Diagnosis not present

## 2024-03-03 DIAGNOSIS — Z6827 Body mass index (BMI) 27.0-27.9, adult: Secondary | ICD-10-CM | POA: Diagnosis not present

## 2024-03-03 DIAGNOSIS — R053 Chronic cough: Secondary | ICD-10-CM | POA: Diagnosis not present

## 2024-04-06 DIAGNOSIS — Z09 Encounter for follow-up examination after completed treatment for conditions other than malignant neoplasm: Secondary | ICD-10-CM | POA: Diagnosis not present

## 2024-04-06 DIAGNOSIS — Z860100 Personal history of colon polyps, unspecified: Secondary | ICD-10-CM | POA: Diagnosis not present

## 2024-04-06 DIAGNOSIS — K621 Rectal polyp: Secondary | ICD-10-CM | POA: Diagnosis not present

## 2024-04-06 DIAGNOSIS — K5730 Diverticulosis of large intestine without perforation or abscess without bleeding: Secondary | ICD-10-CM | POA: Diagnosis not present

## 2024-04-08 DIAGNOSIS — K621 Rectal polyp: Secondary | ICD-10-CM | POA: Diagnosis not present

## 2024-04-20 DIAGNOSIS — K080 Exfoliation of teeth due to systemic causes: Secondary | ICD-10-CM | POA: Diagnosis not present

## 2024-04-29 DIAGNOSIS — R0683 Snoring: Secondary | ICD-10-CM | POA: Diagnosis not present

## 2024-04-30 DIAGNOSIS — K080 Exfoliation of teeth due to systemic causes: Secondary | ICD-10-CM | POA: Diagnosis not present

## 2024-05-04 DIAGNOSIS — J300 Vasomotor rhinitis: Secondary | ICD-10-CM | POA: Diagnosis not present

## 2024-05-04 DIAGNOSIS — R052 Subacute cough: Secondary | ICD-10-CM | POA: Diagnosis not present

## 2024-05-04 DIAGNOSIS — Z91040 Latex allergy status: Secondary | ICD-10-CM | POA: Diagnosis not present

## 2024-05-04 DIAGNOSIS — K219 Gastro-esophageal reflux disease without esophagitis: Secondary | ICD-10-CM | POA: Diagnosis not present

## 2024-05-13 DIAGNOSIS — Z6828 Body mass index (BMI) 28.0-28.9, adult: Secondary | ICD-10-CM | POA: Diagnosis not present

## 2024-05-13 DIAGNOSIS — Z01419 Encounter for gynecological examination (general) (routine) without abnormal findings: Secondary | ICD-10-CM | POA: Diagnosis not present

## 2024-05-13 DIAGNOSIS — M8588 Other specified disorders of bone density and structure, other site: Secondary | ICD-10-CM | POA: Diagnosis not present

## 2024-05-13 DIAGNOSIS — Z1231 Encounter for screening mammogram for malignant neoplasm of breast: Secondary | ICD-10-CM | POA: Diagnosis not present

## 2024-05-14 DIAGNOSIS — G4700 Insomnia, unspecified: Secondary | ICD-10-CM | POA: Diagnosis not present

## 2024-05-14 DIAGNOSIS — R0683 Snoring: Secondary | ICD-10-CM | POA: Diagnosis not present

## 2024-06-03 DIAGNOSIS — M85851 Other specified disorders of bone density and structure, right thigh: Secondary | ICD-10-CM | POA: Diagnosis not present

## 2024-06-03 DIAGNOSIS — Z131 Encounter for screening for diabetes mellitus: Secondary | ICD-10-CM | POA: Diagnosis not present

## 2024-06-03 DIAGNOSIS — D509 Iron deficiency anemia, unspecified: Secondary | ICD-10-CM | POA: Diagnosis not present

## 2024-06-10 DIAGNOSIS — J300 Vasomotor rhinitis: Secondary | ICD-10-CM | POA: Diagnosis not present

## 2024-06-10 DIAGNOSIS — Z6828 Body mass index (BMI) 28.0-28.9, adult: Secondary | ICD-10-CM | POA: Diagnosis not present

## 2024-06-10 DIAGNOSIS — R053 Chronic cough: Secondary | ICD-10-CM | POA: Diagnosis not present

## 2024-06-10 DIAGNOSIS — G4700 Insomnia, unspecified: Secondary | ICD-10-CM | POA: Diagnosis not present

## 2024-06-23 DIAGNOSIS — M25561 Pain in right knee: Secondary | ICD-10-CM | POA: Diagnosis not present

## 2024-06-23 DIAGNOSIS — G4700 Insomnia, unspecified: Secondary | ICD-10-CM | POA: Diagnosis not present

## 2024-06-23 DIAGNOSIS — M25562 Pain in left knee: Secondary | ICD-10-CM | POA: Diagnosis not present

## 2024-07-13 DIAGNOSIS — M25662 Stiffness of left knee, not elsewhere classified: Secondary | ICD-10-CM | POA: Diagnosis not present

## 2024-07-29 DIAGNOSIS — M25662 Stiffness of left knee, not elsewhere classified: Secondary | ICD-10-CM | POA: Diagnosis not present

## 2024-08-05 DIAGNOSIS — Z96652 Presence of left artificial knee joint: Secondary | ICD-10-CM | POA: Diagnosis not present

## 2024-08-05 DIAGNOSIS — M1711 Unilateral primary osteoarthritis, right knee: Secondary | ICD-10-CM | POA: Diagnosis not present

## 2024-08-07 DIAGNOSIS — M1711 Unilateral primary osteoarthritis, right knee: Secondary | ICD-10-CM | POA: Diagnosis not present

## 2024-08-07 DIAGNOSIS — Z96652 Presence of left artificial knee joint: Secondary | ICD-10-CM | POA: Diagnosis not present

## 2024-08-07 DIAGNOSIS — T8482XA Fibrosis due to internal orthopedic prosthetic devices, implants and grafts, initial encounter: Secondary | ICD-10-CM | POA: Diagnosis not present

## 2024-08-11 NOTE — Patient Instructions (Signed)
 SURGICAL WAITING ROOM VISITATION  Patients having surgery or a procedure may have no more than 2 support people in the waiting area - these visitors may rotate.    Children ages 62 and under will not be able to visit patients in [facility] under most circumstances.   Visitors with respiratory illnesses are discouraged from visiting and should remain at home.  If the patient needs to stay at the hospital during part of their recovery, the visitor guidelines for inpatient rooms apply. Pre-op nurse will coordinate an appropriate time for 1 support person to accompany patient in pre-op.  This support person may not rotate.    Please refer to the [facility] website for the visitor guidelines for Inpatients (after your surgery is over and you are in a regular room).    Your procedure is scheduled on: 08/24/24   Report to [facility] Main Entrance    Report to admitting at 12:45 PM   Call this number if you have problems the morning of surgery [phone]   Do not eat food :After Midnight.   After Midnight you may have the following liquids until 12:00 PM DAY OF SURGERY  Water Non-Citrus Juices (without pulp, NO RED-Apple, White grape, White cranberry) Black Coffee (NO MILK/CREAM OR CREAMERS, sugar ok)  Clear Tea (NO MILK/CREAM OR CREAMERS, sugar ok) regular and decaf                             Plain Jell-O (NO RED)                                           Fruit ices (not with fruit pulp, NO RED)                                     Popsicles (NO RED)                                                               Sports drinks like Gatorade (NO RED)                 The day of surgery:  Drink ONE (1) Pre-Surgery Clear Ensure at 12:00 PM the morning of surgery. Drink in one sitting. Do not sip.  This drink was given to you during your hospital  pre-op appointment visit. Nothing else to drink after completing the  Pre-Surgery Clear Ensure.          If you have  questions, please contact your surgeons office.   FOLLOW BOWEL PREP AND ANY ADDITIONAL PRE OP INSTRUCTIONS YOU RECEIVED FROM YOUR SURGEON'S OFFICE!!!     Oral Hygiene is also important to reduce your risk of infection.                                    Remember - BRUSH YOUR TEETH THE MORNING OF SURGERY WITH YOUR REGULAR TOOTHPASTE  DENTURES WILL BE REMOVED PRIOR TO SURGERY PLEASE DO NOT APPLY  Poly grip OR ADHESIVES!!!   Do NOT smoke after Midnight   Stop all vitamins and herbal supplements 7 days before surgery.   Take these medicines the morning of surgery with A SIP OF WATER: Tylenol , Allegra, Omeprazole   DO NOT TAKE ANY ORAL DIABETIC MEDICATIONS DAY OF YOUR SURGERY  Bring CPAP mask and tubing day of surgery.                              You may not have any metal on your body including hair pins, jewelry, and body piercing             Do not wear make-up, lotions, powders, perfumes, or deodorant  Do not wear nail polish including gel and S&S, artificial/acrylic nails, or any other type of covering on natural nails including finger and toenails. If you have artificial nails, gel coating, etc. that needs to be removed by a nail salon please have this removed prior to surgery or surgery may need to be canceled/ delayed if the surgeon/ anesthesia feels like they are unable to be safely monitored.   Do not shave  48 hours prior to surgery.    Do not bring valuables to the hospital. [facility] IS NOT             RESPONSIBLE   FOR VALUABLES.   Contacts, glasses, dentures or bridgework may not be worn into surgery.  DO NOT BRING YOUR HOME MEDICATIONS TO THE HOSPITAL. PHARMACY WILL DISPENSE MEDICATIONS LISTED ON YOUR MEDICATION LIST TO YOU DURING YOUR ADMISSION IN THE HOSPITAL!    Patients discharged on the day of surgery will not be allowed to drive home.  Someone NEEDS to stay with you for the first 24 hours after anesthesia.   Special Instructions: Bring a copy of your  healthcare power of attorney and living will documents the day of surgery if you haven't scanned them before.              Please read over the following fact sheets you were given: IF YOU HAVE QUESTIONS ABOUT YOUR PRE-OP INSTRUCTIONS PLEASE CALL [phone].   If you received a COVID test during your pre-op visit  it is requested that you wear a mask when out in public, stay away from anyone that may not be feeling well and notify your surgeon if you develop symptoms. If you test positive for Covid or have been in contact with anyone that has tested positive in the last 10 days please notify you surgeon.    [facility] - Preparing for Surgery Before surgery, you can play an important role.  Because skin is not sterile, your skin needs to be as free of germs as possible.  You can reduce the number of germs on your skin by washing with CHG (chlorahexidine gluconate) soap before surgery.  CHG is an antiseptic cleaner which kills germs and bonds with the skin to continue killing germs even after washing. Please DO NOT use if you have an allergy to CHG or antibacterial soaps.  If your skin becomes reddened/irritated stop using the CHG and inform your nurse when you arrive at Short Stay. Do not shave (including legs and underarms) for at least 48 hours prior to the first CHG shower.  You may shave your face/neck.  Please follow these instructions carefully:  1.  Shower with CHG Soap the night before surgery ONLY (DO NOT USE THE SOAP THE  MORNING OF SURGERY).  2.  If you choose to wash your hair, wash your hair first as usual with your normal  shampoo.  3.  After you shampoo, rinse your hair and body thoroughly to remove the shampoo.                             4.  Use CHG as you would any other liquid soap.  You can apply chg directly to the skin and wash.  Gently with a scrungie or clean washcloth.  5.  Apply the CHG Soap to your body ONLY FROM THE NECK DOWN.   Do   not use on face/ open                            Wound or open sores. Avoid contact with eyes, ears mouth and   genitals (private parts).                       Wash face,  Genitals (private parts) with your normal soap.             6.  Wash thoroughly, paying special attention to the area where your    surgery  will be performed.  7.  Thoroughly rinse your body with warm water from the neck down.  8.  DO NOT shower/wash with your normal soap after using and rinsing off the CHG Soap.                9.  Pat yourself dry with a clean towel.            10.  Wear clean pajamas.            11.  Place clean sheets on your bed the night of your first shower and do not  sleep with pets. Day of Surgery : Do not apply any CHG, lotions/deodorants the morning of surgery.  Please wear clean clothes to the hospital/surgery center.  FAILURE TO FOLLOW THESE INSTRUCTIONS MAY RESULT IN THE CANCELLATION OF YOUR SURGERY  PATIENT SIGNATURE_________________________________  NURSE SIGNATURE__________________________________  ________________________________________________________________________

## 2024-08-11 NOTE — Progress Notes (Signed)
 Date of COVID positive in last 90 days:  PCP - Sari Pay, MD Cardiologist -   Chest x-ray - N/A EKG - N/A Stress Test - N/A ECHO - N/A Cardiac Cath - N/A Pacemaker/ICD device last checked:N/A Spinal Cord Stimulator:N/A  Bowel Prep - N/A  Sleep Study - N/A CPAP -   Fasting Blood Sugar - N/A Checks Blood Sugar _____ times a day  Last dose of GLP1 agonist-  N/A GLP1 instructions:  Do not take after     Last dose of SGLT-2 inhibitors-  N/A SGLT-2 instructions:  Do not take after     Blood Thinner Instructions: N/A Last dose:   Time: Aspirin Instructions:N/A Last Dose:  Activity level:  Can go up a flight of stairs and perform activities of daily living without stopping and without symptoms of chest pain or shortness of breath.  Able to exercise without symptoms  Unable to go up a flight of stairs without symptoms of     Anesthesia review: N/A  Patient denies shortness of breath, fever, cough and chest pain at PAT appointment  Patient verbalized understanding of instructions that were given to them at the PAT appointment. Patient was also instructed that they will need to review over the PAT instructions again at home before surgery.

## 2024-08-12 ENCOUNTER — Encounter (HOSPITAL_COMMUNITY)
Admission: RE | Admit: 2024-08-12 | Discharge: 2024-08-12 | Disposition: A | Discharge status: Home / Self Care | Source: Ambulatory Visit | Attending: Orthopedic Surgery | Admitting: Orthopedic Surgery

## 2024-08-12 ENCOUNTER — Encounter (HOSPITAL_COMMUNITY): Payer: Self-pay

## 2024-08-12 ENCOUNTER — Other Ambulatory Visit: Payer: Self-pay

## 2024-08-12 VITALS — Ht 60.0 in | Wt 145.0 lb

## 2024-08-12 DIAGNOSIS — Z01818 Encounter for other preprocedural examination: Secondary | ICD-10-CM

## 2024-08-12 HISTORY — DX: Anxiety disorder, unspecified: F41.9

## 2024-08-12 HISTORY — DX: Headache, unspecified: R51.9

## 2024-08-24 ENCOUNTER — Ambulatory Visit (HOSPITAL_COMMUNITY)
Admission: RE | Admit: 2024-08-24 | Discharge: 2024-08-24 | Disposition: A | Discharge status: Home / Self Care | Attending: Orthopedic Surgery | Admitting: Orthopedic Surgery

## 2024-08-24 ENCOUNTER — Other Ambulatory Visit (HOSPITAL_COMMUNITY): Payer: Self-pay

## 2024-08-24 ENCOUNTER — Ambulatory Visit (HOSPITAL_COMMUNITY): Payer: Self-pay | Admitting: Medical

## 2024-08-24 ENCOUNTER — Other Ambulatory Visit: Payer: Self-pay

## 2024-08-24 ENCOUNTER — Encounter (HOSPITAL_COMMUNITY)
Admission: RE | Disposition: A | Discharge status: Home / Self Care | Payer: Self-pay | Source: Home / Self Care | Attending: Orthopedic Surgery

## 2024-08-24 ENCOUNTER — Ambulatory Visit (HOSPITAL_COMMUNITY): Admitting: Anesthesiology

## 2024-08-24 ENCOUNTER — Encounter (HOSPITAL_COMMUNITY): Payer: Self-pay | Admitting: Orthopedic Surgery

## 2024-08-24 DIAGNOSIS — T8482XA Fibrosis due to internal orthopedic prosthetic devices, implants and grafts, initial encounter: Secondary | ICD-10-CM | POA: Diagnosis present

## 2024-08-24 DIAGNOSIS — M24662 Ankylosis, left knee: Secondary | ICD-10-CM | POA: Diagnosis not present

## 2024-08-24 DIAGNOSIS — Z01818 Encounter for other preprocedural examination: Secondary | ICD-10-CM

## 2024-08-24 DIAGNOSIS — X58XXXA Exposure to other specified factors, initial encounter: Secondary | ICD-10-CM | POA: Insufficient documentation

## 2024-08-24 DIAGNOSIS — M1712 Unilateral primary osteoarthritis, left knee: Secondary | ICD-10-CM | POA: Diagnosis not present

## 2024-08-24 DIAGNOSIS — R519 Headache, unspecified: Secondary | ICD-10-CM | POA: Insufficient documentation

## 2024-08-24 DIAGNOSIS — T8482XD Fibrosis due to internal orthopedic prosthetic devices, implants and grafts, subsequent encounter: Secondary | ICD-10-CM

## 2024-08-24 DIAGNOSIS — F419 Anxiety disorder, unspecified: Secondary | ICD-10-CM | POA: Diagnosis not present

## 2024-08-24 DIAGNOSIS — M1990 Unspecified osteoarthritis, unspecified site: Secondary | ICD-10-CM | POA: Diagnosis not present

## 2024-08-24 DIAGNOSIS — K219 Gastro-esophageal reflux disease without esophagitis: Secondary | ICD-10-CM | POA: Insufficient documentation

## 2024-08-24 DIAGNOSIS — Z79818 Long term (current) use of other agents affecting estrogen receptors and estrogen levels: Secondary | ICD-10-CM | POA: Diagnosis not present

## 2024-08-24 DIAGNOSIS — Z87891 Personal history of nicotine dependence: Secondary | ICD-10-CM | POA: Diagnosis not present

## 2024-08-24 DIAGNOSIS — Z79899 Other long term (current) drug therapy: Secondary | ICD-10-CM | POA: Insufficient documentation

## 2024-08-24 HISTORY — PX: KNEE CLOSED REDUCTION: SHX995

## 2024-08-24 LAB — CBC
HCT: 48.2 % — ABNORMAL HIGH (ref 36.0–46.0)
Hemoglobin: 16.2 g/dL — ABNORMAL HIGH (ref 12.0–15.0)
MCH: 31.1 pg (ref 26.0–34.0)
MCHC: 33.6 g/dL (ref 30.0–36.0)
MCV: 92.5 fL (ref 80.0–100.0)
Platelets: 316 K/uL (ref 150–400)
RBC: 5.21 MIL/uL — ABNORMAL HIGH (ref 3.87–5.11)
RDW: 12.3 % (ref 11.5–15.5)
WBC: 7.7 K/uL (ref 4.0–10.5)
nRBC: 0 % (ref 0.0–0.2)

## 2024-08-24 SURGERY — MANIPULATION, KNEE, CLOSED
Anesthesia: General | Site: Knee | Laterality: Left

## 2024-08-24 MED ORDER — METHYLPREDNISOLONE 4 MG PO TBPK
ORAL_TABLET | ORAL | 0 refills | Status: AC
  Filled 2024-08-24: qty 21, 6d supply, fill #0

## 2024-08-24 MED ORDER — FENTANYL CITRATE (PF) 100 MCG/2ML IJ SOLN
INTRAMUSCULAR | Status: DC | PRN
  Administered 2024-08-24: 100 ug via INTRAVENOUS

## 2024-08-24 MED ORDER — OXYCODONE HCL 5 MG/5ML PO SOLN: 5.0000 mg | Freq: Once | ORAL | Status: AC | PRN

## 2024-08-24 MED ORDER — OXYCODONE HCL 5 MG PO TABS
ORAL_TABLET | ORAL | Status: AC
  Filled 2024-08-24: qty 1

## 2024-08-24 MED ORDER — METHOCARBAMOL 500 MG PO TABS
500.0000 mg | ORAL_TABLET | Freq: Four times a day (QID) | ORAL | 0 refills | Status: AC | PRN
  Filled 2024-08-24: qty 30, 8d supply, fill #0

## 2024-08-24 MED ORDER — HYDROCODONE-ACETAMINOPHEN 5-325 MG PO TABS
1.0000 | ORAL_TABLET | ORAL | 0 refills | Status: AC | PRN
  Filled 2024-08-24: qty 20, 4d supply, fill #0

## 2024-08-24 MED ORDER — CHLORHEXIDINE GLUCONATE 0.12 % MT SOLN
15.0000 mL | Freq: Once | OROMUCOSAL | Status: AC
  Administered 2024-08-24: 15 mL via OROMUCOSAL

## 2024-08-24 MED ORDER — FENTANYL CITRATE (PF) 50 MCG/ML IJ SOSY
25.0000 ug | PREFILLED_SYRINGE | INTRAMUSCULAR | Status: DC | PRN
  Administered 2024-08-24 (×3): 50 ug via INTRAVENOUS

## 2024-08-24 MED ORDER — PROPOFOL 500 MG/50ML IV EMUL
INTRAVENOUS | Status: DC | PRN
  Administered 2024-08-24: 150 ug/kg/min via INTRAVENOUS

## 2024-08-24 MED ORDER — ORAL CARE MOUTH RINSE: 15.0000 mL | Freq: Once | OROMUCOSAL | Status: AC

## 2024-08-24 MED ORDER — LACTATED RINGERS IV SOLN: INTRAVENOUS | Status: DC

## 2024-08-24 MED ORDER — OXYCODONE HCL 5 MG PO TABS
5.0000 mg | ORAL_TABLET | Freq: Once | ORAL | Status: AC | PRN
  Administered 2024-08-24: 5 mg via ORAL

## 2024-08-24 MED ORDER — CEFAZOLIN SODIUM-DEXTROSE 2-4 GM/100ML-% IV SOLN
INTRAVENOUS | Status: AC
  Filled 2024-08-24: qty 100

## 2024-08-24 MED ORDER — FENTANYL CITRATE (PF) 50 MCG/ML IJ SOSY
PREFILLED_SYRINGE | INTRAMUSCULAR | Status: AC
  Filled 2024-08-24: qty 3

## 2024-08-24 MED ORDER — LIDOCAINE 2% (20 MG/ML) 5 ML SYRINGE
INTRAMUSCULAR | Status: DC | PRN
  Administered 2024-08-24: 80 mg via INTRAVENOUS

## 2024-08-24 MED ORDER — ONDANSETRON HCL 4 MG/2ML IJ SOLN: 4.0000 mg | Freq: Once | INTRAMUSCULAR | Status: DC | PRN

## 2024-08-24 MED ORDER — FENTANYL CITRATE (PF) 100 MCG/2ML IJ SOLN
INTRAMUSCULAR | Status: AC
  Filled 2024-08-24: qty 2

## 2024-08-24 MED ORDER — MELOXICAM 15 MG PO TABS
15.0000 mg | ORAL_TABLET | Freq: Every day | ORAL | 0 refills | Status: AC
  Filled 2024-08-24: qty 30, 30d supply, fill #0

## 2024-08-24 MED ORDER — DEXAMETHASONE SOD PHOSPHATE PF 10 MG/ML IJ SOLN
8.0000 mg | Freq: Once | INTRAMUSCULAR | Status: AC
  Administered 2024-08-24: 8 mg via INTRAVENOUS

## 2024-08-24 MED ORDER — ACETAMINOPHEN 10 MG/ML IV SOLN: 1000.0000 mg | Freq: Once | INTRAVENOUS | Status: DC | PRN

## 2024-08-24 MED ORDER — ONDANSETRON HCL 4 MG/2ML IJ SOLN
INTRAMUSCULAR | Status: DC | PRN
  Administered 2024-08-24: 4 mg via INTRAVENOUS

## 2024-08-24 MED ORDER — PROPOFOL 10 MG/ML IV BOLUS
INTRAVENOUS | Status: DC | PRN
  Administered 2024-08-24: 50 mg via INTRAVENOUS
  Administered 2024-08-24: 150 mg via INTRAVENOUS

## 2024-08-24 MED ORDER — ACETAMINOPHEN 10 MG/ML IV SOLN
1000.0000 mg | Freq: Four times a day (QID) | INTRAVENOUS | Status: DC
  Administered 2024-08-24: 1000 mg via INTRAVENOUS
  Filled 2024-08-24: qty 100

## 2024-08-24 SURGICAL SUPPLY — 31 items
BAG COUNTER SPONGE SURGICOUNT (BAG) IMPLANT
BAG ZIPLOCK 12X15 (MISCELLANEOUS) ×1 IMPLANT
BNDG ADH 1X3 SHEER STRL LF (GAUZE/BANDAGES/DRESSINGS) IMPLANT
BNDG ELASTIC 6INX 5YD STR LF (GAUZE/BANDAGES/DRESSINGS) ×1 IMPLANT
COVER SURGICAL LIGHT HANDLE (MISCELLANEOUS) ×1 IMPLANT
CUFF TRNQT CYL 34X4.125X (TOURNIQUET CUFF) IMPLANT
DERMABOND ADVANCED .7 DNX12 (GAUZE/BANDAGES/DRESSINGS) ×1 IMPLANT
DRAPE U-SHAPE 47X51 STRL (DRAPES) ×1 IMPLANT
DRSG AQUACEL AG ADV 3.5X10 (GAUZE/BANDAGES/DRESSINGS) ×1 IMPLANT
DURAPREP 26ML APPLICATOR (WOUND CARE) ×1 IMPLANT
ELECT REM PT RETURN 15FT ADLT (MISCELLANEOUS) ×1 IMPLANT
EVACUATOR 1/8 PVC DRAIN (DRAIN) IMPLANT
GAUZE SPONGE 4X4 12PLY STRL (GAUZE/BANDAGES/DRESSINGS) IMPLANT
GLOVE BIO SURGEON STRL SZ 6.5 (GLOVE) ×2 IMPLANT
GLOVE BIO SURGEON STRL SZ7 (GLOVE) ×1 IMPLANT
GLOVE BIO SURGEON STRL SZ8 (GLOVE) ×2 IMPLANT
GLOVE BIOGEL PI IND STRL 7.0 (GLOVE) ×2 IMPLANT
GLOVE BIOGEL PI IND STRL 8 (GLOVE) ×1 IMPLANT
GLOVE SURG SS PI 7.0 STRL IVOR (GLOVE) ×1 IMPLANT
GOWN STRL REUS W/ TWL LRG LVL3 (GOWN DISPOSABLE) ×3 IMPLANT
KIT TURNOVER KIT A (KITS) ×1 IMPLANT
MANIFOLD NEPTUNE II (INSTRUMENTS) ×1 IMPLANT
NDL SAFETY ECLIP 18X1.5 (MISCELLANEOUS) IMPLANT
PACK TOTAL KNEE CUSTOM (KITS) ×1 IMPLANT
PROTECTOR NERVE ULNAR (MISCELLANEOUS) ×1 IMPLANT
STRIP CLOSURE SKIN 1/2X4 (GAUZE/BANDAGES/DRESSINGS) ×2 IMPLANT
SUT MNCRL AB 4-0 PS2 18 (SUTURE) ×1 IMPLANT
SUT VIC AB 2-0 CT1 TAPERPNT 27 (SUTURE) ×2 IMPLANT
SUTURE STRATFX 0 PDS 27 VIOLET (SUTURE) ×1 IMPLANT
SWABSTICK PVP IODINE (MISCELLANEOUS) ×1 IMPLANT
SYR CONTROL 10ML LL (SYRINGE) IMPLANT

## 2024-08-24 NOTE — Anesthesia Preprocedure Evaluation (Addendum)
"                                    Anesthesia Evaluation  Patient identified by MRN, date of birth, ID band Patient awake    Reviewed: Allergy & Precautions, NPO status , Patient's Chart, lab work & pertinent test results, reviewed documented beta blocker date and time   History of Anesthesia Complications (+) PONV and history of anesthetic complications  Airway Mallampati: II  TM Distance: >3 FB     Dental no notable dental hx.    Pulmonary neg shortness of breath, pneumonia, former smoker   breath sounds clear to auscultation       Cardiovascular Exercise Tolerance: Good (-) hypertension(-) angina (-) CAD and (-) Past MI  Rhythm:Regular Rate:Normal     Neuro/Psych  Headaches, neg Seizures  Anxiety      Neuromuscular disease    GI/Hepatic ,GERD  ,,(+) neg Cirrhosis        Endo/Other    Renal/GU Renal disease     Musculoskeletal  (+) Arthritis , Osteoarthritis,    Abdominal   Peds  Hematology   Anesthesia Other Findings   Reproductive/Obstetrics                              Anesthesia Physical Anesthesia Plan  ASA: 2  Anesthesia Plan: General   Post-op Pain Management:    Induction: Intravenous  PONV Risk Score and Plan: 3 and Ondansetron , Dexamethasone  and Propofol  infusion  Airway Management Planned: LMA  Additional Equipment:   Intra-op Plan:   Post-operative Plan: Extubation in OR  Informed Consent: I have reviewed the patients History and Physical, chart, labs and discussed the procedure including the risks, benefits and alternatives for the proposed anesthesia with the patient or authorized representative who has indicated his/her understanding and acceptance.     Dental advisory given  Plan Discussed with: CRNA  Anesthesia Plan Comments:         Anesthesia Quick Evaluation  "

## 2024-08-24 NOTE — Interval H&P Note (Signed)
 History and Physical Interval Note:  08/24/2024 11:22 AM  Emily Ballard  has presented today for surgery, with the diagnosis of Left knee arthrofibrosis.  The various methods of treatment have been discussed with the patient and family. After consideration of risks, benefits and other options for treatment, the patient has consented to  Procedures with comments: MANIPULATION, KNEE, CLOSED (Left) - Left knee closed manipulation vs arthrotomy  and scar excision ARTHROTOMY, KNEE (Left) as a surgical intervention.  The patient's history has been reviewed, patient examined, no change in status, stable for surgery.  I have reviewed the patient's chart and labs.  Questions were answered to the patient's satisfaction.     Dempsey Rayane Gallardo

## 2024-08-24 NOTE — H&P (Signed)
 H&P  Subjective:  HPI: Emily Ballard, 63 y.o. female is s/p left total knee polyethylene revision. Has had issues postoperatively with significant stiffness, which has progressively worsened over time. She denies pain in the knee but describes a pulling sensation around the knee and difficulty performing activities such as climbing stairs and getting in and out of a car. She notes that therapy did not improve her range of motion, and she has been performing similar exercises at the gym without success. Her range of motion has decreased to approximately 85 degrees, with a firm endpoint, and she is unable to flex beyond 90 degrees passively. She previously underwent a closed manipulation in 2022, which initially improved her range of motion to 120-130 degrees, but she has since lost motion over the past year. She expresses concern about her quality of life due to the stiffness and difficulty with daily activities.  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   DJD (degenerative joint disease) of knee 06/20/2015   Primary localized osteoarthritis of left knee    Metatarsalgia of both feet 05/20/2014   Hemorrhoidal skin tags 04/23/2014   Hypertrophied anal papilla 04/23/2014   Tear of left meniscus as current injury 08/13/2013   Left knee pain 01/16/2013   Hamstring strain 05/01/2011   GROIN STRAIN 11/03/2010   ABNORMALITY OF GAIT 06/19/2010   Iliotibial band syndrome affecting left lower leg 01/17/2010   TROCHANTERIC BURSITIS 12/22/2009   STRESS FRACTURE OF TIBIA OR FIBULA 06/06/2009   LEG PAIN, LEFT 05/04/2009   PES PLANUS 05/04/2009    Past Medical History:  Diagnosis Date   Anxiety    GERD (gastroesophageal reflux disease)    Headache    mirgraines   Melanoma (HCC)    right leg   Pneumonia    hx of   PONV (postoperative nausea and vomiting)     Primary localized osteoarthritis of left knee     Past Surgical History:  Procedure Laterality Date   ABDOMINAL HYSTERECTOMY     CARPAL TUNNEL RELEASE Bilateral    CESAREAN SECTION     x2   COLONOSCOPY     ENDOMETRIAL ABLATION     MEDIAL COLLATERAL LIGAMENT AND LATERAL COLLATERAL LIGAMENT REPAIR, KNEE Left    MOUTH SURGERY     TONSILLECTOMY     TOTAL KNEE ARTHROPLASTY Left 06/20/2015   Procedure: TOTAL KNEE ARTHROPLASTY;  Surgeon: Lamar Millman, MD;  Location: [facility] OR;  Service: Orthopedics;  Laterality: Left;   TOTAL KNEE ARTHROPLASTY WITH REVISION COMPONENTS Left 07/17/2017   Procedure: LEFT KNEE POLYETHYLENE REVISION;  Surgeon: Melodi Lerner, MD;  Location: [facility] ORS;  Service: Orthopedics;  Laterality: Left;   TUMOR REMOVAL     throat   WISDOM TOOTH EXTRACTION      Prior to Admission medications  Medication Sig Start Date End Date Taking? Authorizing Provider  acetaminophen  (TYLENOL ) 500 MG tablet Take 1,000 mg by mouth daily as needed.   Yes [provider]  azelastine (ASTELIN) 0.1 % nasal spray Place 1 spray into both nostrils daily. Use in each nostril as directed   Yes [provider]  Calcium Carbonate-Vit D-Min (CALCIUM 1200 PO) Take 1 tablet by mouth every other day.   Yes [provider]  cephALEXin (KEFLEX) 500 MG capsule Take 500 mg by mouth every other day. 01/02/11  Yes [provider]  cycloSPORINE  (  RESTASIS ) 0.05 % ophthalmic emulsion Place 1 drop into both eyes 2 (two) times daily.   Yes [provider]  estradiol (ESTRACE) 1 MG tablet Take 0.5 mg by mouth daily.   Yes [provider]  fexofenadine (ALLEGRA) 180 MG tablet Take 90 mg by mouth daily.   Yes [provider]  fluticasone  (FLONASE ) 50 MCG/ACT nasal spray Place 1 spray into both nostrils daily.   Yes [provider]  METRONIDAZOLE, TOPICAL, 0.75 % LOTN Apply 1 application topically daily.   Yes [provider]  Multiple Vitamin  (MULTIVITAMIN ADULT PO) Take 1 tablet by mouth daily.   Yes [provider]  omeprazole (PRILOSEC) 40 MG capsule Take 40 mg by mouth every morning. 06/01/24  Yes [provider]  Probiotic Product (PROBIOTIC PO) Take 1 capsule by mouth every other day.   Yes [provider]  ramelteon (ROZEREM) 8 MG tablet Take 4 mg by mouth at bedtime. 07/29/24  Yes [provider]  tretinoin (RETIN-A) 0.025 % cream Apply 1 application topically at bedtime.   Yes [provider]  ALPRAZolam  (XANAX ) 0.5 MG tablet Take 0.25 mg by mouth daily as needed for anxiety.    [provider]  estrogens, conjugated, (PREMARIN) 0.625 MG tablet Premarin 0.625 mg tablet Patient not taking: Reported on 08/11/2024    [provider]  loratadine  (CLARITIN ) 10 MG tablet Take 10 mg by mouth daily. Patient not taking: Reported on 08/11/2024    [provider]    Allergies[1]  Social History   Socioeconomic History   Marital status: Married    Spouse name: Not on file   Number of children: Not on file   Years of education: Not on file   Highest education level: Not on file  Occupational History   Not on file  Tobacco Use   Smoking status: Former   Smokeless tobacco: Never  Vaping Use   Vaping status: Never Used  Substance and Sexual Activity   Alcohol use: Yes    Comment: occasionally; 2 glasses a week   Drug use: No   Sexual activity: Not on file  Other Topics Concern   Not on file  Social History Narrative   Not on file   Social Drivers of Health   Tobacco Use: Medium Risk (08/12/2024)   Patient History    Smoking Tobacco Use: Former    Smokeless Tobacco Use: Never    Passive Exposure: Not on Actuary Strain: Not on file  Food Insecurity: Not on file  Transportation Needs: Not on file  Physical Activity: Not on file  Stress: Not on file  Social Connections: Not on file  Intimate Partner Violence: Not on file   Depression (EYV7-0): Not on file  Alcohol Screen: Not on file  Housing: Not on file  Utilities: Not on file  Health Literacy: Not on file    Tobacco Use: Medium Risk (08/12/2024)   Patient History    Smoking Tobacco Use: Former    Smokeless Tobacco Use: Never    Passive Exposure: Not on file   Social History   Substance and Sexual Activity  Alcohol Use Yes   Comment: occasionally; 2 glasses a week    Family History  Problem Relation Age of Onset   Breast cancer Sister 65    Review of Systems  Constitutional:  Negative for chills and fever.  HENT:  Negative for congestion, sore throat and tinnitus.   Eyes:  Negative for double vision,  photophobia and pain.  Respiratory:  Negative for cough, shortness of breath and wheezing.   Cardiovascular:  Negative for chest pain, palpitations and orthopnea.  Gastrointestinal:  Negative for heartburn, nausea and vomiting.  Genitourinary:  Negative for dysuria, frequency and urgency.  Musculoskeletal:  Positive for joint pain.  Neurological:  Negative for dizziness, weakness and headaches.    Objective:  Physical Exam: - Well-developed female, alert, and in no apparent distress. - Left knee: No effusion. Range of motion 0-85 degrees with a firm endpoint. No instability noted.  Imaging Review Radiographs from the last visit, including AP views of both knees and lateral views of the left knee, demonstrate the prosthesis in good position with no periprosthetic abnormalities.  Assessment/Plan:  Left knee arthrofibrosis  Therapy has not restored functional motion, and the patient has been working hard in the gym but cannot flex beyond 85 degrees. There is some spring to the tissue at 85 degrees of flexion, but passive flexion does not exceed 90 degrees. Intervention is necessary to improve range of motion. Given the patient's history of a polyethylene revision five years ago and the current stiffness, an arthrotomy with scar excision  is likely required. A closed manipulation will be attempted first, but if the tissue is too tight, an open procedure will be performed to avoid damage to the prosthesis or periprosthetic bone.   The options of closed manipulation versus open arthrotomy and scar excision were discussed with the patient and she elects to proceed. Postoperatively, she will begin aggressive physical therapy immediately to optimize her recovery.    Prednisone will be prescribed for one week postoperatively, followed by anti-inflammatory medications for a couple of months to reduce the risk of early scar tissue formation. The patient is aware that while these measures may help, they do not guarantee prevention of scar tissue recurrence.   Roxie Mess, PA-C Orthopedic Surgery [facility] [place]      [1]  Allergies Allergen Reactions   Codeine Nausea Only   Latex Other (See Comments)    Causes burn   Shellfish Allergy Diarrhea and Nausea And Vomiting   Sulfamethoxazole Other (See Comments)    GI Upset (intolerance)   Trazodone  And Nefazodone Other (See Comments)    Caused the heart to misshapen and beat irregular

## 2024-08-24 NOTE — Anesthesia Procedure Notes (Signed)
 Procedure Name: LMA Insertion Date/Time: 08/24/2024 12:54 PM  Performed by: Cindie Charleen PARAS, CRNAPre-anesthesia Checklist: Patient identified, Emergency Drugs available, Patient being monitored, Suction available and Timeout performed Patient Re-evaluated:Patient Re-evaluated prior to induction Oxygen Delivery Method: Circle system utilized Preoxygenation: Pre-oxygenation with 100% oxygen Induction Type: IV induction Ventilation: Mask ventilation without difficulty LMA: LMA inserted LMA Size: 4.0 Number of attempts: 1 Placement Confirmation: positive ETCO2 and breath sounds checked- equal and bilateral Tube secured with: Tape Dental Injury: Teeth and Oropharynx as per pre-operative assessment

## 2024-08-24 NOTE — Anesthesia Postprocedure Evaluation (Signed)
"   Anesthesia Post Note  Patient: Emily Ballard  Procedure(s) Performed: MANIPULATION, KNEE, CLOSED (Left: Knee)     Patient location during evaluation: PACU Anesthesia Type: General Level of consciousness: awake and alert Pain management: pain level controlled Vital Signs Assessment: post-procedure vital signs reviewed and stable Respiratory status: spontaneous breathing, nonlabored ventilation, respiratory function stable and patient connected to nasal cannula oxygen Cardiovascular status: blood pressure returned to baseline and stable Postop Assessment: no apparent nausea or vomiting Anesthetic complications: no   No notable events documented.  Last Vitals:  Vitals:   08/24/24 1421 08/24/24 1450  BP: (!) 144/88 (!) 150/88  Pulse: 72   Resp: 20 18  Temp: (!) 36.4 C (!) 36.4 C  SpO2: 95% 95%    Last Pain:  Vitals:   08/24/24 1450  TempSrc:   PainSc: 0-No pain                 Lynwood MARLA Cornea      "

## 2024-08-24 NOTE — Op Note (Signed)
" °  OPERATIVE REPORT   PREOPERATIVE DIAGNOSIS: Arthrofibrosis, Left  knee.   POSTOPERATIVE DIAGNOSIS: Arthrofibrosis, Left knee.   PROCEDURE:  Left  knee closed manipulation.   SURGEON: Dempsey Moan, MD   ASSISTANT: None.   ANESTHESIA: General.   COMPLICATIONS: None.   CONDITION: Stable to Recovery.   Pre-manipulation range of motion is 0-85.  Post-manipulation range of  Motion is 0-130  PROCEDURE IN DETAIL: After successful administration of general  anesthetic, exam under anesthesia was performed showing range of motion  0-85 degrees. I then placed my chest against the proximal tibia,  flexing the knee with audible lysis of adhesions. I was able to  get the knee flexed to 130  degrees. I then put the knee back in full  Extension.The patient was subsequently awakened and transported to Recovery in  stable condition.   "

## 2024-08-24 NOTE — Transfer of Care (Signed)
 Immediate Anesthesia Transfer of Care Note  Patient: Emily Ballard  Procedure(s) Performed: MANIPULATION, KNEE, CLOSED (Left: Knee)  Patient Location: PACU  Anesthesia Type:General  Level of Consciousness: awake, alert , and oriented  Airway & Oxygen Therapy: Patient Spontanous Breathing and Patient connected to face mask oxygen  Post-op Assessment: Report given to RN and Post -op Vital signs reviewed and stable  Post vital signs: Reviewed and stable  Last Vitals:  Vitals Value Taken Time  BP 149/88 08/24/24 13:10  Temp    Pulse 81 08/24/24 13:11  Resp 14 08/24/24 13:11  SpO2 100 % 08/24/24 13:11  Vitals shown include unfiled device data.  Last Pain:  Vitals:   08/24/24 1101  TempSrc: Oral  PainSc: 0-No pain         Complications: No notable events documented.

## 2024-08-25 ENCOUNTER — Encounter (HOSPITAL_COMMUNITY): Payer: Self-pay | Admitting: Orthopedic Surgery
